# Patient Record
Sex: Male | Born: 1975 | Race: White | Hispanic: No | Smoking: Current some day smoker
Health system: Southern US, Community
[De-identification: ages and names within clinical notes are randomized; demographics above are authoritative.]

## PROBLEM LIST (undated history)

## (undated) DIAGNOSIS — A4902 Methicillin resistant Staphylococcus aureus infection, unspecified site: Secondary | ICD-10-CM

## (undated) DIAGNOSIS — L03116 Cellulitis of left lower limb: Secondary | ICD-10-CM

## (undated) DIAGNOSIS — M549 Dorsalgia, unspecified: Secondary | ICD-10-CM

## (undated) HISTORY — PX: WISDOM TOOTH EXTRACTION: SHX21

---

## 2006-04-06 DIAGNOSIS — L03116 Cellulitis of left lower limb: Secondary | ICD-10-CM

## 2006-04-06 HISTORY — DX: Cellulitis of left lower limb: L03.116

## 2006-11-17 ENCOUNTER — Emergency Department (HOSPITAL_COMMUNITY): Admission: EM | Admit: 2006-11-17 | Discharge: 2006-11-17 | Payer: Self-pay | Admitting: Emergency Medicine

## 2006-11-18 ENCOUNTER — Inpatient Hospital Stay (HOSPITAL_COMMUNITY): Admission: EM | Admit: 2006-11-18 | Discharge: 2006-11-21 | Payer: Self-pay | Admitting: Emergency Medicine

## 2010-04-01 ENCOUNTER — Emergency Department (HOSPITAL_COMMUNITY)
Admission: EM | Admit: 2010-04-01 | Discharge: 2010-04-01 | Payer: Self-pay | Source: Home / Self Care | Admitting: Emergency Medicine

## 2010-06-16 ENCOUNTER — Encounter (HOSPITAL_BASED_OUTPATIENT_CLINIC_OR_DEPARTMENT_OTHER): Payer: Self-pay

## 2010-07-14 ENCOUNTER — Encounter (HOSPITAL_BASED_OUTPATIENT_CLINIC_OR_DEPARTMENT_OTHER): Payer: Self-pay

## 2010-08-19 NOTE — Discharge Summary (Signed)
NAME:  Robert Ferguson, Robert Ferguson NO.:  [id number]   MEDICAL RECORD NO.:  [id number]          PATIENT TYPE:  INP   LOCATION:  A320                          FACILITY:  [facility]   PHYSICIAN:  Dorris Singh, DO    DATE OF BIRTH:  October 29, 1975   DATE OF ADMISSION:  11/18/2006  DATE OF DISCHARGE:  08/16/2008LH                               DISCHARGE SUMMARY   ADMISSION DIAGNOSIS:  Cellulitis of the left leg.   DISCHARGE DIAGNOSIS:  Cellulitis of the left leg.   SERVICE:  The patient was admitted to the service of [facility].   REFERRING PHYSICIANS:  There were no referring physicians.   CONSULTATIONS:  There were no consults.   PROCEDURES:  There were no procedures that were done.   HISTORY AND PHYSICAL EXAMINATION:  The patient is a 35 year old  Caucasian male who presented to [facility] ED with a week history of  left foot swelling.  He is a patient of Dr. Kathi Der.  He had gone to the  emergency room at [facility] because he had noticed a spot on his leg.  He  was given Keflex and after a couple days noticed that the swelling had  increased and he came to the [facility] emergency room, was given Septra  and told to return if the cellulitis got worse.  He returned on November 18, 2006 with increased swelling of the left leg and inability to walk.  At that point in time, he was admitted for IV therapy.   HOSPITAL COURSE:  The patient was started on Levaquin and vancomycin due  to the suspicion of failed therapy that he may have MRSA, and once he  started on the medication, on November 19, 2006 it was noted that there  was decreased redness and swelling markedly, as well, and the patient  stated he was able to get up and walk a little bit more than he had been  over the last few days.  Continued on course. The patient denied any  nausea, vomiting, fever or chills.  His white count remained within  normal limits.  On November 18, 2006, his white count was 7.3, on November 19, 2006 it was  6.0, and on November 20, 2006, again, it was 5.1.  His CBC  was within normal limits except for a low platelet count on November 18, 2006 of 149, on November 19, 2006 of 145, but on November 20, 2006 it was  185.  Everything else was pretty unremarkable.  CMP was within normal  limits.  He did have an elevated alkaline phosphatase, but his liver  enzymes were within normal limits, as well as his UA.  Blood cultures  that were done demonstrated no growth within 2 days.  The patient  continued to improve.  On November 20, 2006, it was discussed with the  patient if he felt like he could go home.  The patient wanted to leave  the hospital at this point in time.  It was determined that he could be  discharged on November 21, 2006 after his last  IV antibiotic treatment.  The patient was in agreement with that.   DISCHARGE CONDITION:  Good and stable.   DISPOSITION:  He will be discharged to home with the specific  instructions to contact his primary care physician.   MEDICATIONS:  Medications he will be sent home on include the Levaquin,  and then he will stop the Septra and the Keflex that he was given and  just continue on with the Levaquin 750 mg daily.   FOLLOWUP:  He is told to follow up with Dr. Kathi Der office within 1-3  days for any other follow-up and for monitoring and progression of his  current condition.      Dorris Singh, DO  Electronically Signed     CB/MEDQ  D:  11/20/2006  T:  11/21/2006  Job:  [id number]

## 2010-08-19 NOTE — Group Therapy Note (Signed)
NAME:  Robert Ferguson, SEAGO NO.:  [id number]   MEDICAL RECORD NO.:  [id number]          PATIENT TYPE:  INP   LOCATION:  A320                          FACILITY:  [facility]   PHYSICIAN:  Dorris Singh, DO    DATE OF BIRTH:  [birth year]   DATE OF PROCEDURE:  DATE OF DISCHARGE:                                 PROGRESS NOTE   SUBJECTIVE:  The patient is a 35 year old Caucasian male who presented  to [facility] on August 14 with the complaints of left leg pain. The  patient was admitted for lower extremity cellulitis and started on  vancomycin and Levaquin. Since his admission, the patient's area of  erythema, that was demarcated on his leg, has greatly improved and we  have seen a resection of the redness. The patient stated that today was  first day in four days he was able to actually stand on his leg and take  a shower and definitely has been a change in the redness and pain and  swelling.   PHYSICAL EXAMINATION:  VITAL SIGNS:  His current vitals are as follows:  Temperature 97.8, pulse 58, respirations 28 and blood pressure 135/71.  GENERAL:  Generally the patient is a 35 year old Caucasian male who is  well-developed, well-nourished and in no acute distress.  HEART:  Regular rate and rhythm.  No murmurs, gallops or rubs.  LUNGS:  Respirations are symmetrical.  Breath sounds are clear to  auscultation bilaterally.  No wheezes, rales or rhonchi.  ABDOMEN:  Soft, nontender, and nondistended.  EXTREMITIES:  Left lower extremity positive swelling which has decreased  from yesterday's examination.  Also, the patient had a circular region  of erythema in his inner left thigh that has completely resolved.   LABORATORY DATA:  CBC was done today with a WBC count of 6.0, hemoglobin  of 15.1, hematocrit 43.5, platelet count of 145. Also his INR was one,  PTT is 33. He had a CMP done with a sodium of 142, potassium of five,  chloride of  105, CO2 of 30, glucose of 100, BUN 13, alk  phos of 129,  albumin of 3.1.  He had a urine done with a high specific gravity of  1.030 and a small amount of bilirubin was noted. Liver enzymes were  within normal limits as well.   ASSESSMENT:  Cellulitis of the left leg.   PLAN:  Plan is to continue with current IV therapy utilizing vancomycin  and Levaquin.  The patient continues to improve.  Will encourage more  ambulation and if the patient continues to improve in one day, we will  anticipate discharge in one to two days switching the patient over to  p.o. medications for coverage of infection.      Dorris Singh, DO  Electronically Signed    CB/MEDQ  D:  11/19/2006  T:  11/20/2006  Job:  [id number]

## 2010-08-19 NOTE — H&P (Signed)
NAME:  Robert Ferguson, Robert Ferguson NO.:  [id number]   MEDICAL RECORD NO.:  [id number]          PATIENT TYPE:  INP   LOCATION:  A320                          FACILITY:  [facility]   PHYSICIAN:  Dorris Singh, DO    DATE OF BIRTH:  September 19, 1975   DATE OF ADMISSION:  11/18/2006  DATE OF DISCHARGE:  LH                              HISTORY & PHYSICAL   The patient is a 35 year old Caucasian male who presented to [facility]  ED with a week history of foot swelling.  The patient is a patient of  Dr. [name] and last week and gone to the emergency room at [facility]  because he had noticed a red spot on his left leg.  He was given Keflex  and took it for a couple of days and noticed that the swelling increased  as well as the pain.  He came to [facility] emergency room about 2 days  ago and was given Septra and told to return if the cellulitis continued  to get worse.  He returned today on August 14 with an increased swelling  of the left leg.  The patient was then admitted to [facility].   PAST MEDICAL HISTORY:  The patient denies any past medical history.  The  patient denies any surgeries.   FAMILY HISTORY:  Significant for colon cancer and diabetes.   The patient admits to being a social smoker, probably, he stated only  when he goes out with friends, and also admits to being a social  drinker.   MEDICATIONS THAT HE WAS ON:  He is on Keflex 500 mg four times a day and  Septra twice a day.   REVIEW OF SYSTEMS:  GENERAL:  The patient denies any weakness or  fatigue.  EARS, NOSE AND THROAT:  Noncontributory.  HEART:  Noncontributory.  No palpitations or shortness of breath.  LUNGS:  No  shortness of breath or wheezing.  ABDOMEN:  No nausea, vomiting,  constipation or diarrhea.  GU: No hesitancy, polyuria, polydipsia.  EXTREMITIES:  Left leg:  Positive swelling, positive pain, and increased  redness.  Also the patient noted that he a spot of erythema that  occurred on the  interior portion of his left inner thigh as well.   VITALS:  Temperature 98.5, pulse 69, respirations 20, blood pressure  127/85.  GENERAL:  This is a 35 year old Caucasian male who is well-developed,  well-nourished, in no acute distress.  His affect is appropriate.  SKIN:  The patient has five tattoos.  No skin scars or rashes.  Hair  consistency within normal limits.  No nail pitting.  Head:  Normocephalic, atraumatic.  Eyes equal, reactive to light  bilaterally.  No conjunctival injection or scleral icterus.  Ears:  Symmetrical bilaterally.  TMs visualized and gross auditory acuity  intact.  Nose:  Symmetrical.  No turbinate inflammation or mucosal  discharge.  Mouth and throat:  Teeth are in good repair.  No erythema or  exudate noted.  NECK.  No mass.  Full range of motion, no tracheal deviation.  HEART:  Regular rate and rhythm.  No rubs, gallops, murmurs or clicks.  LUNGS:  Chest is symmetrical with respiration.  No wheezes, rales or  rhonchi.  ABDOMEN:  Soft, nontender, nondistended.  No rebound, no masses or  guarding noted.  No organomegaly noted or CVA tenderness.  MUSCULOSKELETAL:  No muscular atrophy or weakness, full ranges of motion  all extremities.  Positive redness and swelling and tenderness of the  left foreleg and foot.  NEUROLOGIC:  Cranial nerves II-XII grossly intact.   Labs that were done on the patient include a CBC, which demonstrated a  white count of 7.3, hemoglobin of 15.6, hematocrit of 44.7, and platelet  count of 149.  Also a CMP that was done, sodium 138, potassium 4.1,  chloride 102, carbon dioxide 28, glucose 100, BUN 12, creatinine 1.16.  I think those are the only tests he had done.   ASSESSMENT:  Cellulitis of the left leg.   PLAN:  1. Will admit the patient to the service of [facility].  2. Will start IV antibiotics due to the patient failing two      antibiotics outpatient.  Will start the patient on vancomycin to      cover the  possibility of MRSA and also start him on Levaquin.  3. Will also do GI and DVT prophylaxis.  4. Will have the patient on bedrest with elevating the limb and will      continue to monitor him at this point in time.   I explained to the patient the plan of treatment and the patient stated  understanding.      Dorris Singh, DO  Electronically Signed     CB/MEDQ  D:  11/19/2006  T:  11/19/2006  Job:  [id number]

## 2011-01-16 LAB — PROTIME-INR
INR: 1
Prothrombin Time: 13.3

## 2011-01-16 LAB — URINALYSIS, ROUTINE W REFLEX MICROSCOPIC
Glucose, UA: NEGATIVE
Hgb urine dipstick: NEGATIVE
Ketones, ur: NEGATIVE
Nitrite: NEGATIVE
Protein, ur: NEGATIVE
Specific Gravity, Urine: >1.03 — ABNORMAL HIGH
Urobilinogen, UA: 0.2
pH: 5.5

## 2011-01-16 LAB — BASIC METABOLIC PANEL
BUN: 10
CO2: 32
Calcium: 9.2
Chloride: 105
Creatinine, Ser: 1.23
GFR calc Af Amer: >60
GFR calc non Af Amer: >60
Glucose, Bld: 105 — ABNORMAL HIGH
Potassium: 4.8
Sodium: 140

## 2011-01-16 LAB — DIFFERENTIAL
Basophils Absolute: 0
Basophils Absolute: 0
Basophils Relative: 0
Basophils Relative: 0
Eosinophils Absolute: 0.1
Eosinophils Absolute: 0.1
Eosinophils Relative: 1
Eosinophils Relative: 1
Lymphocytes Relative: 23
Lymphocytes Relative: 26
Lymphs Abs: 1.3
Lymphs Abs: 1.4
Monocytes Absolute: 0.5
Monocytes Absolute: 0.7
Monocytes Relative: 11
Monocytes Relative: 13 — ABNORMAL HIGH
Neutro Abs: 3.2
Neutro Abs: 3.8
Neutrophils Relative %: 62
Neutrophils Relative %: 63

## 2011-01-16 LAB — CBC
HCT: 43.5
HCT: 44
Hemoglobin: 15.1
Hemoglobin: 15.3
MCHC: 34.7
MCHC: 34.7
MCV: 85.1
MCV: 86.2
Platelets: 145 — ABNORMAL LOW
Platelets: 185
RBC: 5.04
RBC: 5.17
RDW: 11.9
RDW: 12.1
WBC: 5.1
WBC: 6

## 2011-01-16 LAB — COMPREHENSIVE METABOLIC PANEL
ALT: 52
AST: 27
Albumin: 3.1 — ABNORMAL LOW
Alkaline Phosphatase: 129 — ABNORMAL HIGH
BUN: 13
CO2: 30
Calcium: 9.1
Chloride: 105
Creatinine, Ser: 1.19
GFR calc Af Amer: >60
GFR calc non Af Amer: >60
Glucose, Bld: 100 — ABNORMAL HIGH
Potassium: 5
Sodium: 142
Total Bilirubin: 0.5
Total Protein: 6.4

## 2011-01-16 LAB — APTT: aPTT: 33

## 2011-01-16 LAB — VANCOMYCIN, TROUGH: Vancomycin Tr: 9.7

## 2011-01-18 ENCOUNTER — Encounter: Payer: Self-pay | Admitting: *Deleted

## 2011-01-18 ENCOUNTER — Emergency Department (HOSPITAL_COMMUNITY)
Admission: EM | Admit: 2011-01-18 | Discharge: 2011-01-18 | Disposition: A | Discharge status: Home / Self Care | Payer: Medicaid Other | Attending: Emergency Medicine | Admitting: Emergency Medicine

## 2011-01-18 DIAGNOSIS — L02419 Cutaneous abscess of limb, unspecified: Secondary | ICD-10-CM | POA: Insufficient documentation

## 2011-01-18 DIAGNOSIS — F17200 Nicotine dependence, unspecified, uncomplicated: Secondary | ICD-10-CM | POA: Insufficient documentation

## 2011-01-18 DIAGNOSIS — L02416 Cutaneous abscess of left lower limb: Secondary | ICD-10-CM

## 2011-01-18 DIAGNOSIS — Z8614 Personal history of Methicillin resistant Staphylococcus aureus infection: Secondary | ICD-10-CM | POA: Insufficient documentation

## 2011-01-18 HISTORY — DX: Methicillin resistant Staphylococcus aureus infection, unspecified site: A49.02

## 2011-01-18 MED ORDER — HYDROCODONE-ACETAMINOPHEN 5-325 MG PO TABS: 1.0000 | ORAL_TABLET | Freq: Four times a day (QID) | ORAL | Status: AC | PRN

## 2011-01-18 MED ORDER — DOXYCYCLINE HYCLATE 100 MG PO CAPS: 100.0000 mg | ORAL_CAPSULE | Freq: Two times a day (BID) | ORAL | Status: AC

## 2011-01-18 MED ORDER — IBUPROFEN 800 MG PO TABS
800.0000 mg | ORAL_TABLET | Freq: Once | ORAL | Status: AC
  Administered 2011-01-18: 800 mg via ORAL
  Filled 2011-01-18: qty 1

## 2011-01-18 MED ORDER — DOXYCYCLINE HYCLATE 100 MG PO TABS
100.0000 mg | ORAL_TABLET | Freq: Once | ORAL | Status: AC
  Administered 2011-01-18: 100 mg via ORAL
  Filled 2011-01-18: qty 1

## 2011-01-18 NOTE — ED Notes (Signed)
Pt has large reddened, warm and hard area to his left knee. Open area noted in the center. No drainage noted at this time. Pt alert and oriented x 3. Skin warm and dry. Color pink. Breath sounds clear and equal bilaterally.

## 2011-01-18 NOTE — ED Provider Notes (Signed)
History     CSN: [id number] Arrival date & time: 01/18/2011  8:15 AM  Chief Complaint  Patient presents with  . Abscess    (Consider location/radiation/quality/duration/timing/severity/associated sxs/prior treatment) Patient is a 35 y.o. male presenting with abscess. The history is provided by the patient. No language interpreter was used.  Abscess  This is a new problem. Episode onset: 2 days ago. The problem has been gradually worsening. The abscess is present on the left lower leg. The problem is moderate. The abscess is characterized by draining and redness. The abscess first occurred at home. Pertinent negatives include no fever. His past medical history is significant for skin abscesses in family. His past medical history does not include atopy in family. There were no sick contacts. He has received no recent medical care.    Past Medical History  Diagnosis Date  . MRSA infection     History reviewed. No pertinent past surgical history.  No family history on file.  History  Substance Use Topics  . Smoking status: Current Some Day Smoker  . Smokeless tobacco: Not on file  . Alcohol Use: Yes     Occ      Review of Systems  Constitutional: Negative for fever.  Skin: Positive for wound.  All other systems reviewed and are negative.    Allergies  Review of patient's allergies indicates no known allergies.  Home Medications  No current outpatient prescriptions on file.  BP 158/97  Pulse 102  Temp(Src) 98.7 F (37.1 C) (Oral)  Resp 16  Ht 6' (1.829 m)  SpO2 98%  Physical Exam  Nursing note and vitals reviewed. Constitutional: He is oriented to person, place, and time. Vital signs are normal. He appears well-developed and well-nourished. No distress.  HENT:  Head: Normocephalic and atraumatic.  Right Ear: External ear normal.  Left Ear: External ear normal.  Nose: Nose normal.  Mouth/Throat: No oropharyngeal exudate.  Eyes: Conjunctivae and EOM are  normal. Pupils are equal, round, and reactive to light. Right eye exhibits no discharge. Left eye exhibits no discharge. No scleral icterus.  Neck: Normal range of motion. Neck supple. No JVD present. No tracheal deviation present. No thyromegaly present.  Cardiovascular: Normal rate, regular rhythm, normal heart sounds, intact distal pulses and normal pulses.  Exam reveals no gallop and no friction rub.   No murmur heard. Pulmonary/Chest: Effort normal and breath sounds normal. No stridor. No respiratory distress. He has no wheezes. He has no rales. He exhibits no tenderness.  Abdominal: Soft. Normal appearance and bowel sounds are normal. He exhibits no distension and no mass. There is no tenderness. There is no rebound and no guarding.  Musculoskeletal: Normal range of motion. He exhibits no edema and no tenderness.  Lymphadenopathy:    He has no cervical adenopathy.  Neurological: He is alert and oriented to person, place, and time. He has normal reflexes. Coordination normal. GCS eye subscore is 4. GCS verbal subscore is 5. GCS motor subscore is 6.  Skin: Skin is warm and dry. No rash noted. He is not diaphoretic.     Psychiatric: He has a normal mood and affect. His speech is normal and behavior is normal. Judgment and thought content normal. Cognition and memory are normal.    ED Course  Procedures (including critical care time)  Labs Reviewed - No data to display No results found.   No diagnosis found.    MDM  Pt agrees to tx plan and will f/u with PCP.  Worthy Rancher, PA 01/18/11 [phone]

## 2011-01-18 NOTE — ED Notes (Signed)
Pt states boil to left knee with hx of mrsa. Pt states area has been draining a little bit. NAD

## 2011-01-18 NOTE — ED Provider Notes (Signed)
Medical screening examination/treatment/procedure(s) were performed by non-physician practitioner and as supervising physician I was immediately available for consultation/collaboration.  Glynn Octave, MD 01/18/11 [phone]

## 2011-01-19 LAB — DIFFERENTIAL
Basophils Absolute: 0
Basophils Relative: 0
Eosinophils Absolute: 0.1
Eosinophils Relative: 1
Lymphocytes Relative: 20
Lymphs Abs: 1.4
Monocytes Absolute: 0.8 — ABNORMAL HIGH
Monocytes Relative: 11
Neutro Abs: 5
Neutrophils Relative %: 69

## 2011-01-19 LAB — BASIC METABOLIC PANEL
BUN: 12
CO2: 28
Calcium: 8.9
Chloride: 102
Creatinine, Ser: 1.16
GFR calc Af Amer: >60
GFR calc non Af Amer: >60
Glucose, Bld: 100 — ABNORMAL HIGH
Potassium: 4.1
Sodium: 138

## 2011-01-19 LAB — CULTURE, BLOOD (ROUTINE X 2)
Culture: NO GROWTH
Culture: NO GROWTH
Report Status: 8192008
Report Status: 8192008

## 2011-01-19 LAB — CBC
HCT: 44.7
Hemoglobin: 15.6
MCHC: 34.8
MCV: 85.1
Platelets: 149 — ABNORMAL LOW
RBC: 5.26
RDW: 12.2
WBC: 7.3

## 2011-03-24 ENCOUNTER — Emergency Department (HOSPITAL_COMMUNITY)
Admission: EM | Admit: 2011-03-24 | Discharge: 2011-03-24 | Disposition: A | Discharge status: Home / Self Care | Payer: Medicaid Other | Attending: Emergency Medicine | Admitting: Emergency Medicine

## 2011-03-24 ENCOUNTER — Encounter (HOSPITAL_COMMUNITY): Payer: Self-pay | Admitting: *Deleted

## 2011-03-24 DIAGNOSIS — F17200 Nicotine dependence, unspecified, uncomplicated: Secondary | ICD-10-CM | POA: Insufficient documentation

## 2011-03-24 DIAGNOSIS — L03119 Cellulitis of unspecified part of limb: Secondary | ICD-10-CM | POA: Insufficient documentation

## 2011-03-24 DIAGNOSIS — L02419 Cutaneous abscess of limb, unspecified: Secondary | ICD-10-CM | POA: Insufficient documentation

## 2011-03-24 MED ORDER — ONDANSETRON HCL 4 MG PO TABS
4.0000 mg | ORAL_TABLET | Freq: Once | ORAL | Status: AC
  Administered 2011-03-24: 4 mg via ORAL
  Filled 2011-03-24: qty 1

## 2011-03-24 MED ORDER — HYDROCODONE-ACETAMINOPHEN 5-325 MG PO TABS: 1.0000 | ORAL_TABLET | ORAL | Status: AC | PRN

## 2011-03-24 MED ORDER — AMOXICILLIN-POT CLAVULANATE 875-125 MG PO TABS
1.0000 | ORAL_TABLET | Freq: Once | ORAL | Status: AC
  Administered 2011-03-24: 1 via ORAL
  Filled 2011-03-24: qty 1

## 2011-03-24 MED ORDER — AMOXICILLIN 500 MG PO CAPS: ORAL_CAPSULE | ORAL | Status: DC

## 2011-03-24 MED ORDER — SULFAMETHOXAZOLE-TRIMETHOPRIM 800-160 MG PO TABS: ORAL_TABLET | ORAL | Status: DC

## 2011-03-24 MED ORDER — SULFAMETHOXAZOLE-TMP DS 800-160 MG PO TABS
1.0000 | ORAL_TABLET | Freq: Once | ORAL | Status: AC
  Administered 2011-03-24: 1 via ORAL
  Filled 2011-03-24: qty 1

## 2011-03-24 NOTE — ED Provider Notes (Signed)
History     CSN: [id number] Arrival date & time: 03/24/2011  2:20 PM   None     Chief Complaint  Patient presents with  . Abscess    (Consider location/radiation/quality/duration/timing/severity/associated sxs/prior treatment) Patient is a 35 y.o. male presenting with abscess. The history is provided by the patient.  Abscess  This is a new problem. The current episode started less than one week ago. The problem occurs continuously. The problem has been gradually worsening. The abscess is present on the left upper leg. The problem is moderate. The abscess is characterized by redness and painfulness. It is unknown what he was exposed to. Pertinent negatives include no anorexia, no fever and no cough. Past medical history comments: Similar symptoms previously. There were no sick contacts. He has received no recent medical care.    Past Medical History  Diagnosis Date  . MRSA infection     History reviewed. No pertinent past surgical history.  History reviewed. No pertinent family history.  History  Substance Use Topics  . Smoking status: Current Some Day Smoker    Types: Cigarettes  . Smokeless tobacco: Not on file  . Alcohol Use: Yes     Occ      Review of Systems  Constitutional: Negative for fever and activity change.       All ROS Neg except as noted in HPI  HENT: Negative for nosebleeds and neck pain.   Eyes: Negative for photophobia and discharge.  Respiratory: Negative for cough, shortness of breath and wheezing.   Cardiovascular: Negative for chest pain and palpitations.  Gastrointestinal: Negative for abdominal pain, blood in stool and anorexia.  Genitourinary: Negative for dysuria, frequency and hematuria.  Musculoskeletal: Negative for back pain and arthralgias.  Skin: Negative.   Neurological: Negative for dizziness, seizures and speech difficulty.  Psychiatric/Behavioral: Negative for hallucinations and confusion.    Allergies  Review of patient's  allergies indicates no known allergies.  Home Medications  No current outpatient prescriptions on file.  BP 148/87  Pulse 77  Temp(Src) 98 F (36.7 C) (Oral)  Resp 16  Ht 6' (1.829 m)  Wt 195 lb (88.451 kg)  BMI 26.45 kg/m2  SpO2 98%  Physical Exam  Nursing note and vitals reviewed. Constitutional: He is oriented to person, place, and time. He appears well-developed and well-nourished.  Non-toxic appearance.  HENT:  Head: Normocephalic.  Right Ear: Tympanic membrane and external ear normal.  Left Ear: Tympanic membrane and external ear normal.  Eyes: EOM and lids are normal. Pupils are equal, round, and reactive to light.  Neck: Normal range of motion. Neck supple. Carotid bruit is not present.  Cardiovascular: Normal rate, regular rhythm, normal heart sounds, intact distal pulses and normal pulses.   Pulmonary/Chest: Breath sounds normal. No respiratory distress.  Abdominal: Soft. Bowel sounds are normal. There is no tenderness. There is no guarding.  Musculoskeletal: Normal range of motion.       Small abscess of the left lateral thigh with increase redness around the site. No palpable nodes  Lymphadenopathy:       Head (right side): No submandibular adenopathy present.       Head (left side): No submandibular adenopathy present.    He has no cervical adenopathy.  Neurological: He is alert and oriented to person, place, and time. He has normal strength. No cranial nerve deficit or sensory deficit.  Skin: Skin is warm and dry.  Psychiatric: He has a normal mood and affect. His speech is normal.  ED Course  Procedures (including critical care time)  Labs Reviewed - No data to display No results found.   No diagnosis found.    MDM  I have reviewed nursing notes, vital signs, and all appropriate lab and imaging results for this patient.  This abscess is not a candidate for I and D at this time. Will treat with antibiotics and tub soaks. Pt to see his MD or return  to the ED for evaluation.      Kathie Dike, [place] 03/24/11 1728

## 2011-03-24 NOTE — ED Notes (Signed)
Pt c/o boil on his left upper leg since yesterday. Pt states that it is hard and red. Denies drainage.

## 2011-03-24 NOTE — ED Provider Notes (Signed)
Medical screening examination/treatment/procedure(s) were performed by non-physician practitioner and as supervising physician I was immediately available for consultation/collaboration.   Shaun Zuccaro, MD 03/24/11 1753 

## 2011-12-28 ENCOUNTER — Emergency Department (HOSPITAL_COMMUNITY)
Admission: EM | Admit: 2011-12-28 | Discharge: 2011-12-28 | Disposition: A | Discharge status: Home / Self Care | Payer: Self-pay | Attending: Emergency Medicine | Admitting: Emergency Medicine

## 2011-12-28 ENCOUNTER — Encounter (HOSPITAL_COMMUNITY): Payer: Self-pay | Admitting: *Deleted

## 2011-12-28 ENCOUNTER — Emergency Department (HOSPITAL_COMMUNITY): Payer: Self-pay

## 2011-12-28 DIAGNOSIS — M25579 Pain in unspecified ankle and joints of unspecified foot: Secondary | ICD-10-CM | POA: Insufficient documentation

## 2011-12-28 DIAGNOSIS — Z8614 Personal history of Methicillin resistant Staphylococcus aureus infection: Secondary | ICD-10-CM | POA: Insufficient documentation

## 2011-12-28 DIAGNOSIS — L03119 Cellulitis of unspecified part of limb: Secondary | ICD-10-CM

## 2011-12-28 DIAGNOSIS — F17200 Nicotine dependence, unspecified, uncomplicated: Secondary | ICD-10-CM | POA: Insufficient documentation

## 2011-12-28 LAB — CBC WITH DIFFERENTIAL/PLATELET
Basophils Absolute: 0 10*3/uL (ref 0.0–0.1)
Basophils Relative: 0 % (ref 0–1)
Eosinophils Absolute: 0.1 10*3/uL (ref 0.0–0.7)
Eosinophils Relative: 1 % (ref 0–5)
HCT: 51.2 % (ref 39.0–52.0)
Hemoglobin: 17.9 g/dL — ABNORMAL HIGH (ref 13.0–17.0)
Lymphocytes Relative: 17 % (ref 12–46)
Lymphs Abs: 1.7 10*3/uL (ref 0.7–4.0)
MCH: 30.4 pg (ref 26.0–34.0)
MCHC: 35 g/dL (ref 30.0–36.0)
MCV: 86.9 fL (ref 78.0–100.0)
Monocytes Absolute: 0.7 10*3/uL (ref 0.1–1.0)
Monocytes Relative: 7 % (ref 3–12)
Neutro Abs: 7.5 10*3/uL (ref 1.7–7.7)
Neutrophils Relative %: 75 % (ref 43–77)
Platelets: 157 10*3/uL (ref 150–400)
RBC: 5.89 MIL/uL — ABNORMAL HIGH (ref 4.22–5.81)
RDW: 11.6 % (ref 11.5–15.5)
WBC: 10.1 10*3/uL (ref 4.0–10.5)

## 2011-12-28 LAB — URIC ACID: Uric Acid, Serum: 6.2 mg/dL (ref 4.0–7.8)

## 2011-12-28 MED ORDER — CEFTRIAXONE SODIUM 1 G IJ SOLR
1.0000 g | Freq: Once | INTRAMUSCULAR | Status: AC
  Administered 2011-12-28: 1 g via INTRAMUSCULAR
  Filled 2011-12-28: qty 10

## 2011-12-28 MED ORDER — DOXYCYCLINE HYCLATE 100 MG PO TABS
100.0000 mg | ORAL_TABLET | Freq: Once | ORAL | Status: AC
  Administered 2011-12-28: 100 mg via ORAL
  Filled 2011-12-28: qty 1

## 2011-12-28 MED ORDER — LIDOCAINE HCL (PF) 1 % IJ SOLN
INTRAMUSCULAR | Status: AC
  Administered 2011-12-28: 22:00:00
  Filled 2011-12-28: qty 5

## 2011-12-28 MED ORDER — SULFAMETHOXAZOLE-TRIMETHOPRIM 800-160 MG PO TABS: 1.0000 | ORAL_TABLET | Freq: Two times a day (BID) | ORAL | Status: DC

## 2011-12-28 MED ORDER — OXYCODONE-ACETAMINOPHEN 5-325 MG PO TABS: 1.0000 | ORAL_TABLET | ORAL | Status: AC | PRN

## 2011-12-28 MED ORDER — OXYCODONE-ACETAMINOPHEN 5-325 MG PO TABS
1.0000 | ORAL_TABLET | Freq: Once | ORAL | Status: AC
Start: 2011-12-28 — End: 2011-12-28
  Administered 2011-12-28: 1 via ORAL
  Filled 2011-12-28: qty 1

## 2011-12-28 NOTE — ED Notes (Signed)
Cellulitis rt ankle, redness and swelling present

## 2011-12-28 NOTE — ED Provider Notes (Signed)
History     CSN: [id number]  Arrival date & time 12/28/11  1932   First MD Initiated Contact with Patient 12/28/11 2037      Chief Complaint  Patient presents with  . Cellulitis    (Consider location/radiation/quality/duration/timing/severity/associated sxs/prior treatment) HPI Comments: Patient c/o redness, mild swelling and "soreness" to the right ankle.  States he has a hx of previous cellulitis to the same ankle and is concerned he has similar sx's again.  He denies recent injury, fever, chills, calf pain , open wound or red streaks.    Patient is a 36 y.o. male presenting with ankle pain. The history is provided by the patient.  Ankle Pain  The incident occurred 2 days ago. The incident occurred at home. There was no injury mechanism. The pain is present in the right ankle. The quality of the pain is described as aching. The pain is mild. The pain has been constant since onset. Pertinent negatives include no numbness, no inability to bear weight, no loss of motion, no muscle weakness, no loss of sensation and no tingling. He reports no foreign bodies present. Nothing aggravates the symptoms. He has tried nothing for the symptoms. The treatment provided no relief.    Past Medical History  Diagnosis Date  . MRSA infection     History reviewed. No pertinent past surgical history.  History reviewed. No pertinent family history.  History  Substance Use Topics  . Smoking status: Current Some Day Smoker    Types: Cigarettes  . Smokeless tobacco: Not on file  . Alcohol Use: Yes     Occ      Review of Systems  Constitutional: Negative for fever and chills.  Genitourinary: Negative for dysuria and difficulty urinating.  Musculoskeletal: Positive for joint swelling and arthralgias.  Skin: Positive for color change. Negative for rash and wound.  Neurological: Negative for tingling and numbness.  All other systems reviewed and are negative.    Allergies  Review of  patient's allergies indicates no known allergies.  Home Medications  No current outpatient prescriptions on file.  BP 115/58  Pulse 73  Temp 98.1 F (36.7 C) (Oral)  Resp 20  Ht 6' (1.829 m)  Wt 198 lb (89.812 kg)  BMI 26.85 kg/m2  SpO2 98%  Physical Exam  Nursing note and vitals reviewed. Constitutional: He is oriented to person, place, and time. He appears well-developed and well-nourished. No distress.  HENT:  Head: Normocephalic and atraumatic.  Cardiovascular: Normal rate, regular rhythm, normal heart sounds and intact distal pulses.   Pulmonary/Chest: Effort normal and breath sounds normal.  Musculoskeletal: He exhibits edema and tenderness.       Right ankle: He exhibits swelling. He exhibits normal range of motion, no ecchymosis, no deformity, no laceration and normal pulse. tenderness. Medial malleolus tenderness found. No posterior TFL, no head of 5th metatarsal and no proximal fibula tenderness found. Achilles tendon normal.       Feet:       Right medial ankle is ttp, slight STS is present. Slight erythema also present. ROM is preserved.  DP pulse is brisk, sensation intact.  No  abrasion, bruising, calf pain open wounds, or deformity.    Neurological: He is alert and oriented to person, place, and time. He exhibits normal muscle tone. Coordination normal.  Skin: Skin is warm and dry.    ED Course  Procedures (including critical care time)  Labs Reviewed  CBC WITH DIFFERENTIAL - Abnormal; Notable for the following:  RBC 5.89 (*)     Hemoglobin 17.9 (*)     All other components within normal limits  URIC ACID   Dg Ankle Complete Right  12/28/2011  *RADIOLOGY REPORT*  Clinical Data: Redness, pain and swelling.  Medial right ankle pain.  RIGHT ANKLE - COMPLETE 3+ VIEW  Comparison: None.  Findings: No osteolysis.  The ankle mortise appears congruent. Nonstandard projections are submitted for interpretation.  Grossly, the talar dome appears intact.  No radiopaque  foreign body is identified.  There is a tiny insertional calcaneal spur.  IMPRESSION: No acute abnormality.   Original Report Authenticated By: Andreas Newport, M.D.        MDM    Localized area of erythema to the medial right ankle.  Hx of cellulitis, no known hx of gout.  I have marked the leading edge of erythema and pt agrees to return here in 1-2 days for recheck. Will start abx.  He is otherwise well appearing, no fever or leukocytosis .  The patient appears reasonably screened and/or stabilized for discharge and I doubt any other medical condition or other [facility] requiring further screening, evaluation, or treatment in the ED at this time prior to discharge.   Rx: Bactrim DS Percocet #20    Zyrah Wiswell L. [place], [place] 12/30/11 1842

## 2011-12-30 NOTE — ED Provider Notes (Signed)
Medical screening examination/treatment/procedure(s) were performed by non-physician practitioner and as supervising physician I was immediately available for consultation/collaboration.   Shelda Jakes, MD 12/30/11 2046

## 2012-01-16 DIAGNOSIS — R42 Dizziness and giddiness: Secondary | ICD-10-CM

## 2012-01-19 ENCOUNTER — Encounter (HOSPITAL_COMMUNITY): Payer: Self-pay | Admitting: *Deleted

## 2012-01-19 ENCOUNTER — Emergency Department (HOSPITAL_COMMUNITY)
Admission: EM | Admit: 2012-01-19 | Discharge: 2012-01-19 | Disposition: A | Discharge status: Home / Self Care | Payer: Self-pay | Attending: Emergency Medicine | Admitting: Emergency Medicine

## 2012-01-19 DIAGNOSIS — M549 Dorsalgia, unspecified: Secondary | ICD-10-CM | POA: Insufficient documentation

## 2012-01-19 DIAGNOSIS — Z8614 Personal history of Methicillin resistant Staphylococcus aureus infection: Secondary | ICD-10-CM | POA: Insufficient documentation

## 2012-01-19 DIAGNOSIS — F17200 Nicotine dependence, unspecified, uncomplicated: Secondary | ICD-10-CM | POA: Insufficient documentation

## 2012-01-19 DIAGNOSIS — X500XXA Overexertion from strenuous movement or load, initial encounter: Secondary | ICD-10-CM | POA: Insufficient documentation

## 2012-01-19 DIAGNOSIS — S29012A Strain of muscle and tendon of back wall of thorax, initial encounter: Secondary | ICD-10-CM

## 2012-01-19 MED ORDER — KETOROLAC TROMETHAMINE 10 MG PO TABS
10.0000 mg | ORAL_TABLET | Freq: Once | ORAL | Status: AC
  Administered 2012-01-19: 10 mg via ORAL
  Filled 2012-01-19: qty 1

## 2012-01-19 MED ORDER — HYDROCODONE-ACETAMINOPHEN 5-325 MG PO TABS
2.0000 | ORAL_TABLET | Freq: Once | ORAL | Status: AC
  Administered 2012-01-19: 2 via ORAL
  Filled 2012-01-19: qty 2

## 2012-01-19 MED ORDER — HYDROCODONE-ACETAMINOPHEN 5-325 MG PO TABS: 1.0000 | ORAL_TABLET | ORAL | Status: DC | PRN

## 2012-01-19 MED ORDER — METHOCARBAMOL 500 MG PO TABS
1000.0000 mg | ORAL_TABLET | Freq: Once | ORAL | Status: AC
  Administered 2012-01-19: 1000 mg via ORAL
  Filled 2012-01-19: qty 2

## 2012-01-19 MED ORDER — MELOXICAM 7.5 MG PO TABS: ORAL_TABLET | ORAL | Status: DC

## 2012-01-19 MED ORDER — METHOCARBAMOL 500 MG PO TABS: ORAL_TABLET | ORAL | Status: DC

## 2012-01-19 NOTE — ED Provider Notes (Signed)
History     CSN: [id number]  Arrival date & time 01/19/12  1736   None     Chief Complaint  Patient presents with  . Back Pain    (Consider location/radiation/quality/duration/timing/severity/associated sxs/prior treatment) HPI Comments: Patient states that he was lifting a older model TV on last evening. Patient states that this morning upon arising he had a severe pain in his mid back. The pain was worse with certain movements. The patient denies cough or hemoptysis. The patient denies any loss of bowel or bladder function. He's not had any previous injury or procedure to the mid back area. Patient states that the pain is getting progressively worse and he presents for assistance with his pain.  The history is provided by the patient.    Past Medical History  Diagnosis Date  . MRSA infection     History reviewed. No pertinent past surgical history.  History reviewed. No pertinent family history.  History  Substance Use Topics  . Smoking status: Current Some Day Smoker    Types: Cigarettes  . Smokeless tobacco: Not on file  . Alcohol Use: Yes     Occ      Review of Systems  Constitutional: Negative for activity change.       All ROS Neg except as noted in HPI  HENT: Negative for nosebleeds and neck pain.   Eyes: Negative for photophobia and discharge.  Respiratory: Negative for cough, shortness of breath and wheezing.   Cardiovascular: Negative for chest pain and palpitations.  Gastrointestinal: Negative for abdominal pain and blood in stool.  Genitourinary: Negative for dysuria, frequency and hematuria.  Musculoskeletal: Positive for back pain. Negative for arthralgias.  Skin: Negative.   Neurological: Negative for dizziness, seizures and speech difficulty.  Psychiatric/Behavioral: Negative for hallucinations and confusion.    Allergies  Review of patient's allergies indicates no known allergies.  Home Medications   Current Outpatient Rx  Name Route Sig  Dispense Refill  . SULFAMETHOXAZOLE-TRIMETHOPRIM 800-160 MG PO TABS Oral Take 1 tablet by mouth 2 (two) times daily. 20 tablet 0    BP 131/70  Pulse 63  Temp 98.6 F (37 C) (Oral)  Resp 18  Ht 6' (1.829 m)  Wt 195 lb (88.451 kg)  BMI 26.45 kg/m2  SpO2 98%  Physical Exam  Nursing note and vitals reviewed. Constitutional: He is oriented to person, place, and time. He appears well-developed and well-nourished.  Non-toxic appearance.  HENT:  Head: Normocephalic.  Right Ear: Tympanic membrane and external ear normal.  Left Ear: Tympanic membrane and external ear normal.  Eyes: EOM and lids are normal. Pupils are equal, round, and reactive to light.  Neck: Normal range of motion. Neck supple. Carotid bruit is not present.  Cardiovascular: Normal rate, regular rhythm, normal heart sounds, intact distal pulses and normal pulses.   Pulmonary/Chest: Breath sounds normal. No respiratory distress.  Abdominal: Soft. Bowel sounds are normal. There is no tenderness. There is no guarding.  Musculoskeletal: Normal range of motion.       There is pain to palpation and mid thoracic paraspinal area spasm present. No hot areas of the spine area. No bruising or deformity appreciated.  Lymphadenopathy:       Head (right side): No submandibular adenopathy present.       Head (left side): No submandibular adenopathy present.    He has no cervical adenopathy.  Neurological: He is alert and oriented to person, place, and time. He has normal strength. No cranial nerve deficit  or sensory deficit. He exhibits normal muscle tone. Coordination normal.  Skin: Skin is warm and dry.  Psychiatric: He has a normal mood and affect. His speech is normal.    ED Course  Procedures (including critical care time)  Labs Reviewed - No data to display No results found.   No diagnosis found.    MDM  I have reviewed nursing notes, vital signs, and all appropriate lab and imaging results for this patient. No gross  neurologic deficits appreciated on examination at this time. Prescription for Mobic 7.5 mg, Norco #15 tablets, and Robaxin 500 mg given to the patient. Patient is advised to use ice to 20 minute intervals to the lower back area.       Kathie Dike, [place] 01/19/12 [phone]

## 2012-01-19 NOTE — ED Notes (Signed)
Low back pain , onset after lifting a tv.

## 2012-01-20 NOTE — ED Provider Notes (Signed)
Medical screening examination/treatment/procedure(s) were performed by non-physician practitioner and as supervising physician I was immediately available for consultation/collaboration.   Gabriell Casimir L Elleni Mozingo, MD 01/20/12 0007 

## 2012-05-20 ENCOUNTER — Emergency Department (HOSPITAL_COMMUNITY)
Admission: EM | Admit: 2012-05-20 | Discharge: 2012-05-20 | Discharge status: Against Medical Advice | Payer: Self-pay | Attending: Emergency Medicine | Admitting: Emergency Medicine

## 2012-05-20 ENCOUNTER — Encounter (HOSPITAL_COMMUNITY): Payer: Self-pay | Admitting: *Deleted

## 2012-05-20 DIAGNOSIS — M7989 Other specified soft tissue disorders: Secondary | ICD-10-CM | POA: Insufficient documentation

## 2012-05-20 DIAGNOSIS — F17200 Nicotine dependence, unspecified, uncomplicated: Secondary | ICD-10-CM | POA: Insufficient documentation

## 2012-05-20 NOTE — ED Notes (Signed)
Left without being seen.

## 2012-05-20 NOTE — ED Notes (Signed)
Pt states lump on lower back with pain from old injury x 4 days, rt foot swelling with no pain

## 2012-05-23 ENCOUNTER — Encounter (HOSPITAL_COMMUNITY): Payer: Self-pay | Admitting: Emergency Medicine

## 2012-05-23 ENCOUNTER — Emergency Department (HOSPITAL_COMMUNITY): Payer: Self-pay

## 2012-05-23 ENCOUNTER — Emergency Department (HOSPITAL_COMMUNITY)
Admission: EM | Admit: 2012-05-23 | Discharge: 2012-05-23 | Disposition: A | Discharge status: Home / Self Care | Payer: Self-pay | Attending: Emergency Medicine | Admitting: Emergency Medicine

## 2012-05-23 DIAGNOSIS — R609 Edema, unspecified: Secondary | ICD-10-CM | POA: Insufficient documentation

## 2012-05-23 DIAGNOSIS — M549 Dorsalgia, unspecified: Secondary | ICD-10-CM | POA: Insufficient documentation

## 2012-05-23 DIAGNOSIS — R600 Localized edema: Secondary | ICD-10-CM

## 2012-05-23 DIAGNOSIS — Z87828 Personal history of other (healed) physical injury and trauma: Secondary | ICD-10-CM | POA: Insufficient documentation

## 2012-05-23 DIAGNOSIS — F17200 Nicotine dependence, unspecified, uncomplicated: Secondary | ICD-10-CM | POA: Insufficient documentation

## 2012-05-23 DIAGNOSIS — Z8614 Personal history of Methicillin resistant Staphylococcus aureus infection: Secondary | ICD-10-CM | POA: Insufficient documentation

## 2012-05-23 MED ORDER — HYDROCODONE-ACETAMINOPHEN 5-325 MG PO TABS: 1.0000 | ORAL_TABLET | ORAL | Status: DC | PRN

## 2012-05-23 MED ORDER — HYDROCODONE-ACETAMINOPHEN 5-325 MG PO TABS
1.0000 | ORAL_TABLET | Freq: Once | ORAL | Status: AC
  Administered 2012-05-23: 1 via ORAL
  Filled 2012-05-23: qty 1

## 2012-05-23 MED ORDER — HYDROCHLOROTHIAZIDE 25 MG PO TABS: 25.0000 mg | ORAL_TABLET | Freq: Every day | ORAL | Status: DC

## 2012-05-23 MED ORDER — IBUPROFEN 600 MG PO TABS: 600.0000 mg | ORAL_TABLET | Freq: Four times a day (QID) | ORAL | Status: DC | PRN

## 2012-05-23 NOTE — ED Notes (Addendum)
Patient with no complaints at this time. Respirations even and unlabored. Skin warm/dry. Discharge instructions reviewed with patient at this time. Patient given opportunity to voice concerns/ask questions. My Chart reviewed w/pt. Patient discharged at this time and left Emergency Department with steady gait.

## 2012-05-23 NOTE — ED Provider Notes (Signed)
History     CSN: [id number]  Arrival date & time 05/23/12  8295   First MD Initiated Contact with Patient 05/23/12 [phone]      Chief Complaint  Patient presents with  . Back Pain   HPI Robert Ferguson is a 37 y.o. male who presents to the ED with back pain. The pain started 5 days ago. The onset was sudden after walking down steps and felt something move in her back. History of back injury a several months ago. Pain off and on since then. Also complains of swelling in his right foot that is new. He denies pain in the area just swelling.  The history was provided by the patient.  Past Medical History  Diagnosis Date  . MRSA infection     History reviewed. No pertinent past surgical history.  History reviewed. No pertinent family history.  History  Substance Use Topics  . Smoking status: Current Some Day Smoker    Types: Cigarettes  . Smokeless tobacco: Not on file  . Alcohol Use: Yes     Comment: Occ      Review of Systems  Constitutional: Negative for fever and appetite change.  HENT: Negative for neck pain.   Eyes: Negative for visual disturbance.  Respiratory: Negative for cough and wheezing.   Cardiovascular: Negative for chest pain.  Gastrointestinal: Negative for nausea, vomiting and abdominal pain.  Musculoskeletal: Positive for back pain.       Swelling of right foot  Skin: Negative for wound.  Neurological: Negative for dizziness and headaches.  Psychiatric/Behavioral: Negative for confusion. The patient is not nervous/anxious.     Allergies  Tramadol  Home Medications  No current outpatient prescriptions on file.  Pulse 68  Temp(Src) 98.5 F (36.9 C) (Oral)  Resp 16  Ht 6' (1.829 m)  Wt 200 lb (90.719 kg)  BMI 27.12 kg/m2  SpO2 99%  Physical Exam  Nursing note and vitals reviewed. Constitutional: He is oriented to person, place, and time. He appears well-developed and well-nourished. No distress.  HENT:  Head: Normocephalic and atraumatic.   Eyes: EOM are normal. Pupils are equal, round, and reactive to light.  Neck: Normal range of motion. Neck supple.  Cardiovascular: Normal rate.   Pulmonary/Chest: Effort normal.  Musculoskeletal:  Tenderness right lumbar area and over the lumbar spine. Small lipomas palpable right lumbar area. Tender on exam. Right foot with edema. No tenderness, pedal pulses present.   Neurological: He is alert and oriented to person, place, and time. No cranial nerve deficit.  Skin: Skin is warm and dry.  Psychiatric: He has a normal mood and affect. His behavior is normal. Judgment and thought content normal.  Pulse 68  Temp(Src) 98.5 F (36.9 C) (Oral)  Resp 16  Ht 6' (1.829 m)  Wt 200 lb (90.719 kg)  BMI 27.12 kg/m2  SpO2 99%  BP 115/81  Procedures  Dg Lumbar Spine Complete  05/23/2012  *RADIOLOGY REPORT*  Clinical Data: Back pain without trauma  LUMBAR SPINE - COMPLETE 4+ VIEW  Comparison: None.  Findings: Transitional S1 vertebral body. Sacroiliac joints are symmetric.  Maintenance of vertebral body height and alignment. Intervertebral disc heights are maintained.  IMPRESSION: No acute osseous abnormality.   Original Report Authenticated By: Jeronimo Greaves, M.D.     12:30 Dr. Estell Harpin in to examine the patient.  Assessment: 37 y.o. male with low back pain   Edema of  right foot  Plan:  Pain management   Diuretic   Follow  up with PCP for further work up of edema right foot  Discussed with the patient and all questioned fully answered. He will return if problems arise.   Medication List    TAKE these medications       hydrochlorothiazide 25 MG tablet  Commonly known as:  HYDRODIURIL  Take 1 tablet (25 mg total) by mouth daily.     HYDROcodone-acetaminophen 5-325 MG per tablet  Commonly known as:  NORCO/VICODIN  Take 1 tablet by mouth every 4 (four) hours as needed for pain.     ibuprofen 600 MG tablet  Commonly known as:  ADVIL,MOTRIN  Take 1 tablet (600 mg total) by mouth every 6  (six) hours as needed for pain.         Janne Napoleon, NP 05/23/12 1248

## 2012-05-23 NOTE — ED Notes (Signed)
Pt states back started hurting five days ago. Pt states walking down steps and felt something move in back. Pt states injured back a few years ago.

## 2012-05-23 NOTE — ED Notes (Signed)
Pt states he is not driving and has a way home.

## 2012-05-23 NOTE — ED Notes (Signed)
Patient states that he is having lower back pain and he has noticed that his right foot is swelling.  States he has been having this problems for over 6 months.  States that injured his lower back about 6 months ago, but has not seen a doctor.

## 2012-05-23 NOTE — ED Provider Notes (Signed)
Medical screening examination/treatment/procedure(s) were performed by non-physician practitioner and as supervising physician I was immediately available for consultation/collaboration.   Melania Kirks L Hindy Perrault, MD 05/23/12 1535 

## 2012-05-23 NOTE — ED Notes (Signed)
Patient states he needs something for pain. RN made aware.

## 2012-09-24 ENCOUNTER — Inpatient Hospital Stay (HOSPITAL_COMMUNITY)
Admission: EM | Admit: 2012-09-24 | Discharge: 2012-09-25 | DRG: 603 | Disposition: A | Discharge status: Home / Self Care | Payer: MEDICAID | Attending: Internal Medicine | Admitting: Internal Medicine

## 2012-09-24 ENCOUNTER — Encounter (HOSPITAL_COMMUNITY): Payer: Self-pay | Admitting: *Deleted

## 2012-09-24 DIAGNOSIS — D696 Thrombocytopenia, unspecified: Secondary | ICD-10-CM | POA: Diagnosis present

## 2012-09-24 DIAGNOSIS — L02419 Cutaneous abscess of limb, unspecified: Principal | ICD-10-CM | POA: Diagnosis present

## 2012-09-24 DIAGNOSIS — L03115 Cellulitis of right lower limb: Secondary | ICD-10-CM | POA: Diagnosis present

## 2012-09-24 DIAGNOSIS — F17200 Nicotine dependence, unspecified, uncomplicated: Secondary | ICD-10-CM | POA: Diagnosis present

## 2012-09-24 DIAGNOSIS — L03119 Cellulitis of unspecified part of limb: Principal | ICD-10-CM | POA: Diagnosis present

## 2012-09-24 HISTORY — DX: Dorsalgia, unspecified: M54.9

## 2012-09-24 HISTORY — DX: Cellulitis of left lower limb: L03.116

## 2012-09-24 LAB — CBC WITH DIFFERENTIAL/PLATELET
Basophils Absolute: 0 10*3/uL (ref 0.0–0.1)
Basophils Relative: 0 % (ref 0–1)
Eosinophils Absolute: 0 10*3/uL (ref 0.0–0.7)
Eosinophils Relative: 0 % (ref 0–5)
HCT: 48 % (ref 39.0–52.0)
Hemoglobin: 16.8 g/dL (ref 13.0–17.0)
Lymphocytes Relative: 13 % (ref 12–46)
Lymphs Abs: 0.9 10*3/uL (ref 0.7–4.0)
MCH: 30.3 pg (ref 26.0–34.0)
MCHC: 35 g/dL (ref 30.0–36.0)
MCV: 86.6 fL (ref 78.0–100.0)
Monocytes Absolute: 0.7 10*3/uL (ref 0.1–1.0)
Monocytes Relative: 10 % (ref 3–12)
Neutro Abs: 5.2 10*3/uL (ref 1.7–7.7)
Neutrophils Relative %: 77 % (ref 43–77)
Platelets: 105 10*3/uL — ABNORMAL LOW (ref 150–400)
RBC: 5.54 MIL/uL (ref 4.22–5.81)
RDW: 12 % (ref 11.5–15.5)
WBC: 6.7 10*3/uL (ref 4.0–10.5)

## 2012-09-24 LAB — BASIC METABOLIC PANEL
BUN: 14 mg/dL (ref 6–23)
CO2: 27 mEq/L (ref 19–32)
Calcium: 9.2 mg/dL (ref 8.4–10.5)
Chloride: 101 mEq/L (ref 96–112)
Creatinine, Ser: 1.01 mg/dL (ref 0.50–1.35)
GFR calc Af Amer: >90 mL/min (ref >90)
GFR calc non Af Amer: >90 mL/min (ref >90)
Glucose, Bld: 107 mg/dL — ABNORMAL HIGH (ref 70–99)
Potassium: 3.5 mEq/L (ref 3.5–5.1)
Sodium: 138 mEq/L (ref 135–145)

## 2012-09-24 MED ORDER — TRAZODONE HCL 50 MG PO TABS
25.0000 mg | ORAL_TABLET | Freq: Every evening | ORAL | Status: DC | PRN
  Filled 2012-09-24: qty 1

## 2012-09-24 MED ORDER — VANCOMYCIN HCL IN DEXTROSE 1-5 GM/200ML-% IV SOLN
INTRAVENOUS | Status: AC
  Filled 2012-09-24: qty 200

## 2012-09-24 MED ORDER — ALUM & MAG HYDROXIDE-SIMETH 200-200-20 MG/5ML PO SUSP
30.0000 mL | Freq: Four times a day (QID) | ORAL | Status: DC | PRN
  Administered 2012-09-25: 30 mL via ORAL
  Filled 2012-09-24: qty 30

## 2012-09-24 MED ORDER — POTASSIUM CHLORIDE IN NACL 20-0.9 MEQ/L-% IV SOLN
INTRAVENOUS | Status: DC
  Administered 2012-09-24 (×2): via INTRAVENOUS
  Filled 2012-09-24: qty 1000

## 2012-09-24 MED ORDER — ACETAMINOPHEN 325 MG PO TABS: 650.0000 mg | ORAL_TABLET | Freq: Four times a day (QID) | ORAL | Status: DC | PRN

## 2012-09-24 MED ORDER — GUAIFENESIN-DM 100-10 MG/5ML PO SYRP: 5.0000 mL | ORAL_SOLUTION | ORAL | Status: DC | PRN

## 2012-09-24 MED ORDER — ONDANSETRON HCL 4 MG/2ML IJ SOLN: 4.0000 mg | Freq: Four times a day (QID) | INTRAMUSCULAR | Status: DC | PRN

## 2012-09-24 MED ORDER — VANCOMYCIN HCL IN DEXTROSE 1-5 GM/200ML-% IV SOLN
1000.0000 mg | Freq: Once | INTRAVENOUS | Status: AC
  Administered 2012-09-24: 1000 mg via INTRAVENOUS
  Filled 2012-09-24: qty 200

## 2012-09-24 MED ORDER — ACETAMINOPHEN 650 MG RE SUPP: 650.0000 mg | Freq: Four times a day (QID) | RECTAL | Status: DC | PRN

## 2012-09-24 MED ORDER — ONDANSETRON HCL 4 MG PO TABS: 4.0000 mg | ORAL_TABLET | Freq: Four times a day (QID) | ORAL | Status: DC | PRN

## 2012-09-24 MED ORDER — VANCOMYCIN HCL IN DEXTROSE 1-5 GM/200ML-% IV SOLN
1000.0000 mg | Freq: Three times a day (TID) | INTRAVENOUS | Status: DC
  Administered 2012-09-24 – 2012-09-25 (×3): 1000 mg via INTRAVENOUS
  Filled 2012-09-24 (×5): qty 200

## 2012-09-24 MED ORDER — OXYCODONE HCL 5 MG PO TABS
5.0000 mg | ORAL_TABLET | ORAL | Status: DC | PRN
  Administered 2012-09-24 – 2012-09-25 (×3): 5 mg via ORAL
  Filled 2012-09-24 (×3): qty 1

## 2012-09-24 MED ORDER — LEVOFLOXACIN 750 MG PO TABS
750.0000 mg | ORAL_TABLET | Freq: Every day | ORAL | Status: DC
  Administered 2012-09-24 – 2012-09-25 (×2): 750 mg via ORAL
  Filled 2012-09-24 (×2): qty 1

## 2012-09-24 NOTE — ED Notes (Signed)
Just finished eating dinner, pain free at this time. Attempted to call report to floor, nurse busy with shift change, will attempt again in a few minutes

## 2012-09-24 NOTE — ED Provider Notes (Signed)
History  This chart was scribed for Donnetta Hutching, MD, by Yevette Edwards, ED Scribe. This patient was seen in room [facility]08[facility]08 and the patient's care was started at 4:37 PM.  CSN: [id number]  Arrival date & time 09/24/12  1545   First MD Initiated Contact with Patient 09/24/12 1601      Chief Complaint  Patient presents with  . Leg Pain    (Consider location/radiation/quality/duration/timing/severity/associated sxs/prior treatment) The history is provided by the patient. No language interpreter was used.   HPI Comments: Robert Ferguson is a 37 y.o. male who presents to the Emergency Department complaining of a gradual onset, constant rash on his right leg which began yesterday. He reports that the rash began on his right upper thigh and then radiated to his lower right extremity and right foot. The pt a h/o cellulitis and MRSA. He denies a h/o diabetes. He admits to smoking daily and to occasionally drinking alcohol.   Past Medical History  Diagnosis Date  . MRSA infection     History reviewed. No pertinent past surgical history.  No family history on file.  History  Substance Use Topics  . Smoking status: Current Some Day Smoker    Types: Cigarettes  . Smokeless tobacco: Not on file  . Alcohol Use: Yes     Comment: Occ    Review of Systems A complete 10 system review of systems was obtained, and all systems are negative except where mentioned in the HPI and PE.  Allergies  Tramadol  Home Medications  No current outpatient prescriptions on file.  Triage Vitals: BP 140/67  Pulse 78  Temp(Src) 98.7 F (37.1 C) (Oral)  Resp 18  Ht 6' (1.829 m)  Wt 195 lb (88.451 kg)  BMI 26.44 kg/m2  SpO2 100%  Physical Exam  Nursing note and vitals reviewed. Constitutional: He is oriented to person, place, and time. He appears well-developed and well-nourished.  HENT:  Head: Normocephalic and atraumatic.  Eyes: Conjunctivae and EOM are normal. Pupils are equal, round, and  reactive to light.  Neck: Normal range of motion. Neck supple.  Cardiovascular: Normal rate, regular rhythm and normal heart sounds.   Pulmonary/Chest: Effort normal and breath sounds normal.  Abdominal: Soft. Bowel sounds are normal.  Musculoskeletal: Normal range of motion.  Neurological: He is alert and oriented to person, place, and time.  Skin: Skin is warm and dry.  Proximal, medial right thigh has a 5 cm diameter erythematous, macular rash. Medial mid-shaft right tibia has a 3 x 12 cm erythematous, macular rash. Entire dorsum of right foot has a erythematous, macular rash.  Psychiatric: He has a normal mood and affect.    ED Course  Procedures (including critical care time)   COORDINATION OF CARE:  5:06 PM- Discussed treatment plan with pt which includes blood work and IV antibiotics.   DIAGNOSTIC STUDIES: Oxygen Saturation is 100% on room air, normal by my interpretation.    Labs Reviewed  BASIC METABOLIC PANEL - Abnormal; Notable for the following:    Glucose, Bld 107 (*)    All other components within normal limits  CBC WITH DIFFERENTIAL - Abnormal; Notable for the following:    Platelets 105 (*)    All other components within normal limits   No results found.   No diagnosis found.    MDM  Patient has rapidly progressing erythematous macular rash consistent with cellulitis which started on his right thigh and has progressed to the dorsum of the right foot.  White count and glucose normal.  IV vancomycin. Admit    I personally performed the services described in this documentation, which was scribed in my presence. The recorded information has been reviewed and is accurate.       Donnetta Hutching, MD 09/24/12 [phone]

## 2012-09-24 NOTE — Progress Notes (Signed)
ANTIBIOTIC CONSULT NOTE - INITIAL  Pharmacy Consult for Vancomycin Indication: cellulitis  Allergies  Allergen Reactions  . Tramadol Nausea Only    Patient Measurements: Height: 6' (182.9 cm) Weight: 197 lb 3.2 oz (89.449 kg) IBW/kg (Calculated) : 77.6  Vital Signs: Temp: 98.7 F (37.1 C) (06/21 2005) Temp src: Oral (06/21 2005) BP: 137/84 mmHg (06/21 2005) Pulse Rate: 79 (06/21 2005) Intake/Output from previous day:   Intake/Output from this shift:    Labs:  Recent Labs  09/24/12 1701  WBC 6.7  HGB 16.8  PLT 105*  CREATININE 1.01   Estimated Creatinine Clearance: 109.9 ml/min (by C-G formula based on Cr of 1.01). No results found for this basename: VANCOTROUGH, VANCOPEAK, VANCORANDOM, GENTTROUGH, GENTPEAK, GENTRANDOM, TOBRATROUGH, TOBRAPEAK, TOBRARND, AMIKACINPEAK, AMIKACINTROU, AMIKACIN,  in the last 72 hours   Microbiology: No results found for this or any previous visit (from the past 720 hour(s)).  Medical History: Past Medical History  Diagnosis Date  . MRSA infection   . Cellulitis of left leg 2008  . Back pain     Medications:  Scheduled:  . levofloxacin  750 mg Oral Daily   Assessment: 37 yo M admitted with cellulitis of RLE.   He has a history of MRSA abscess/cellulitis of left knee in 2012.  He was started on IV Vancomycin & oral Levaquin. Renal function is at patient's baseline.    Goal of Therapy:  Vancomycin trough level 10-15 mcg/ml  Plan:  1) Vancomycin 1gm IV q8h 2) Check Vancomycin trough at steady state 3) Monitor renal function and cx data   Robert Ferguson 09/24/2012,10:04 PM

## 2012-09-24 NOTE — H&P (Signed)
[facility] History and Physical  Robert Ferguson Robert Ferguson:[id number] DOB: September 30, 1975 DOA: 09/24/2012  Referring physician: Dr. Adriana Simas PCP: [name], MD  Specialists: None  Chief Complaint: Right leg pain and redness  HPI: Robert Ferguson is a 37 y.o. male with a past medical history significant for MRSA abscess/ cellulitis, who presents to the emergency department with a chief complaint of right leg redness and pain. He noticed redness and discomfort initially on his right thigh yesterday. Since that time, the redness has spread to his right calf area and right foot. No open wounds or drainage. He does not recall being bitten by any insects. He denies trauma. He had subjective fever and chills yesterday. He has also had generalized fatigue. He denies chest pain, pleurisy, nausea, vomiting, diarrhea, or abdominal pain.  In the emergency department, he is afebrile and hemodynamically stable. His lab data are significant for platelet count of 105. He is being admitted for further evaluation and management.  Review of Systems: As above in history present illness. Otherwise negative.  Past Medical History  Diagnosis Date  . MRSA infection   . Cellulitis of left leg 2008  . Back pain    History reviewed. No pertinent past surgical history. Social History: He is divorced. He has 2 sons. He is employed. He drinks beer 3-4 days weekly. He generally smokes 1-2 cigarettes while drinking beer 3-4 days a week. He denies illicit drug use.  Allergies  Allergen Reactions  . Tramadol Nausea Only    Family history: His mother and father are both living and had no chronic medical conditions.  Prior to Admission medications   Not on File   Physical Exam: Filed Vitals:   09/24/12 1556  BP: 140/67  Pulse: 78  Temp: 98.7 F (37.1 C)  TempSrc: Oral  Resp: 18  Height: 6' (1.829 m)  Weight: 88.451 kg (195 lb)  SpO2: 100%     General:  Well-nourished alert 37 year old Caucasian man laying in  bed, in no acute distress.  Eyes: Pupils are equal, round, and reactive to light. Extraocular movements are intact. Conjunctivae are clear. Sclerae are white.  ENT: Oropharynx reveals mildly dry mucous membranes.  Neck: Supple, no adenopathy, no thyromegaly, no JVD.  Cardiovascular: S1, S2, with no murmurs rubs or gallops.  Respiratory: Clear to auscultation bilaterally.  Abdomen: Positive bowel sounds, soft, nontender, nondistended.  Skin: Multiple tattoos on his arms noted. A patch of erythema of his medial right thigh, erythema of the medial calf area, and global erythema/warmth and 1+ edema over the dorsum of his right foot. Pedal pulses palpable bilaterally. Right dorsum moderately tender. No edema or tenderness on the left lower extremity.  Musculoskeletal: Right foot with global erythema and edema, otherwise no other acute hot red joints.  Psychiatric: Pleasant affect.  Neurologic: Alert and oriented x3. Cranial nerves II through XII are intact.  Labs on Admission:  Basic Metabolic Panel:  Recent Labs Lab 09/24/12 1701  NA 138  K 3.5  CL 101  CO2 27  GLUCOSE 107*  BUN 14  CREATININE 1.01  CALCIUM 9.2   Liver Function Tests: No results found for this basename: AST, ALT, ALKPHOS, BILITOT, PROT, ALBUMIN,  in the last 168 hours No results found for this basename: LIPASE, AMYLASE,  in the last 168 hours No results found for this basename: AMMONIA,  in the last 168 hours CBC:  Recent Labs Lab 09/24/12 1701  WBC 6.7  NEUTROABS 5.2  HGB 16.8  HCT 48.0  MCV 86.6  PLT 105*   Cardiac Enzymes: No results found for this basename: CKTOTAL, CKMB, CKMBINDEX, TROPONINI,  in the last 168 hours  BNP (last 3 results) No results found for this basename: PROBNP,  in the last 8760 hours CBG: No results found for this basename: GLUCAP,  in the last 168 hours  Radiological Exams on Admission: No results found.  EKG:   Assessment/Plan Principal Problem:   Cellulitis  of right lower extremity Active Problems:   Thrombocytopenia   The patient will be admitted for treatment of right lower extremity cellulitis. He reports a history of MRSA cellulitis/abscess of his left knee in 2012 and generalized cellulitis of his left lower extremity in 2008. They were both treated successfully with antibiotics, but the latter required hospitalization. He was given vancomycin in the emergency department. We'll continue vancomycin and add oral Levaquin. Etiology of his thrombocytopenia is unclear, possibly secondary to the infection. Will assess his thyroid function and vitamin B12 level. Will hold on Lovenox or heparin for DVT prophylaxis because of the thrombocytopenia and because it is likely he will have early ambulation. Nevertheless, will place SCDs that will help with the edema of his right lower extremity. The patient was encouraged to stop smoking completely.    Code Status: Full code Family Communication: None available Disposition Plan: Anticipate discharge to home in 48 hours.  Time spent: 45 minutes  [facility] Pager [phone]  If 7PM-7AM, please contact night-coverage [email or web address] Password [facility] 09/24/2012, 6:28 PM

## 2012-09-24 NOTE — ED Notes (Signed)
Redness to upper right leg yesterday.  States woke up this morning with red streak down right leg and right foot red and swollen.  C/o pain.

## 2012-09-25 DIAGNOSIS — L02419 Cutaneous abscess of limb, unspecified: Secondary | ICD-10-CM

## 2012-09-25 DIAGNOSIS — D696 Thrombocytopenia, unspecified: Secondary | ICD-10-CM

## 2012-09-25 DIAGNOSIS — L03119 Cellulitis of unspecified part of limb: Secondary | ICD-10-CM

## 2012-09-25 LAB — CBC
HCT: 46.6 % (ref 39.0–52.0)
Hemoglobin: 16.3 g/dL (ref 13.0–17.0)
MCH: 30.6 pg (ref 26.0–34.0)
MCHC: 35 g/dL (ref 30.0–36.0)
MCV: 87.4 fL (ref 78.0–100.0)
Platelets: 101 10*3/uL — ABNORMAL LOW (ref 150–400)
RBC: 5.33 MIL/uL (ref 4.22–5.81)
RDW: 12 % (ref 11.5–15.5)
WBC: 5.8 10*3/uL (ref 4.0–10.5)

## 2012-09-25 LAB — COMPREHENSIVE METABOLIC PANEL
ALT: 48 U/L (ref 0–53)
AST: 28 U/L (ref 0–37)
Albumin: 3.1 g/dL — ABNORMAL LOW (ref 3.5–5.2)
Alkaline Phosphatase: 76 U/L (ref 39–117)
BUN: 14 mg/dL (ref 6–23)
CO2: 29 mEq/L (ref 19–32)
Calcium: 8.7 mg/dL (ref 8.4–10.5)
Chloride: 102 mEq/L (ref 96–112)
Creatinine, Ser: 1 mg/dL (ref 0.50–1.35)
GFR calc Af Amer: >90 mL/min (ref >90)
GFR calc non Af Amer: >90 mL/min (ref >90)
Glucose, Bld: 119 mg/dL — ABNORMAL HIGH (ref 70–99)
Potassium: 3.5 mEq/L (ref 3.5–5.1)
Sodium: 138 mEq/L (ref 135–145)
Total Bilirubin: 0.3 mg/dL (ref 0.3–1.2)
Total Protein: 6.6 g/dL (ref 6.0–8.3)

## 2012-09-25 LAB — VITAMIN B12: Vitamin B-12: 340 pg/mL (ref 211–911)

## 2012-09-25 LAB — TSH: TSH: 0.522 u[IU]/mL (ref 0.350–4.500)

## 2012-09-25 MED ORDER — LEVOFLOXACIN 750 MG PO TABS: 750.0000 mg | ORAL_TABLET | Freq: Every day | ORAL | Status: DC

## 2012-09-25 MED ORDER — OXYCODONE-ACETAMINOPHEN 2.5-325 MG PO TABS: 1.0000 | ORAL_TABLET | ORAL | Status: DC | PRN

## 2012-09-25 NOTE — Discharge Summary (Signed)
Physician Discharge Summary  Robert Ferguson ZOX:[id number] DOB: April 17, 1975 DOA: 09/24/2012  PCP: [name], MD  Admit date: 09/24/2012 Discharge date: 09/26/2012  Time spent: Less than 30 minutes  Recommendations for Outpatient Follow-up:  1. The patient was advised to followup with Dr. [name] as needed or within ED if his cellulitis worsens or returns. 2. His platelet count will need to be reassessed at his followup appointment.  Discharge Diagnoses:  Principal Problem:   Cellulitis of right lower extremity Active Problems:   Thrombocytopenia   Discharge Condition: Improved.  Diet recommendation: Regular as tolerated  Filed Weights   09/24/12 1556 09/24/12 2005  Weight: 88.451 kg (195 lb) 89.449 kg (197 lb 3.2 oz)    History of present illness:  The patient is a 37 year old man with a past medical history significant for MRSA abscess/cellulitis who presented to the emergency department on 09/24/2012 with a chief complaint of right leg redness and pain. He denied trauma or any known insect bites. He had subjective fever and chills once. In the emergency department, he was afebrile and hemodynamically stable. His lab data were significant for platelet count of 105. He was admitted for further evaluation and management.   Hospital Course:  The patient was started on IV fluid hydration. He was given 1 g of vancomycin in the emergency department. He was subsequently started on vancomycin as dosed per pharmacy. During the hospitalization, he received at least 3 doses of IV vancomycin. In addition, Levaquin was started orally for gram negative in addition to gram-positive coverage. He received 2 full doses. Over the course of the hospitalization, the erythema and edema over his right foot subsided substantially. The erythema over his right calf resolved completely. He still had minimal erythema of his right thigh. Tenderness and warmth subsided. He remained afebrile and hemodynamically stable.  For evaluation of his thrombocytopenia, TSH and vitamin B12 level were ordered. The results were pending at the time of discharge. He was informed that the dictating physician would call him if the results were abnormal. He was instructed to followup with his primary care physician Dr. [name] for followup of his cellulitis and thrombocytopenia.   The patient drinks beer several days weekly. This appears to be believable as there was no evidence of alcohol abuse.  He also smokes several times weekly. He was encouraged to stop smoking altogether.  He was discharged on 7 more days of Levaquin at 750 mg daily.  Procedures:  None   Consultations:  None   Discharge Exam: Filed Vitals:   09/24/12 1840 09/24/12 2005 09/25/12 0641 09/25/12 1429  BP: 119/71 137/84 141/84 149/77  Pulse: 66 79 61 55  Temp:  98.7 F (37.1 C) 98.1 F (36.7 C) 97.5 F (36.4 C)  TempSrc:  Oral Oral Oral  Resp: 20 20 20 20   Height:  6' (1.829 m)    Weight:  89.449 kg (197 lb 3.2 oz)    SpO2: 98% 98% 100% 99%    General: no acute distress  Cardiovascular: S1, S2, with borderline bradycardia.  Respiratory: clear to auscultation bilaterally. Skin/extremities: Small patch of erythema proximal right thigh, resolution of right calf erythema, and significantly less edema and erythema over the right dorsum. Trace of erythema and edema of right foot. Nontender.  Discharge Instructions      Discharge Orders   Future Orders Complete By Expires     Diet general  As directed     Discharge instructions  As directed     Comments:  Follow up with your primary doctor in 1 week to have your platelet count rechecked.    Increase activity slowly  As directed         Medication List    TAKE these medications       levofloxacin 750 MG tablet  Commonly known as:  LEVAQUIN  Take 1 tablet (750 mg total) by mouth daily.     oxycodone-acetaminophen 2.5-325 MG per tablet  Commonly known as:  PERCOCET  Take 1 tablet  by mouth every 4 (four) hours as needed for pain.       Allergies  Allergen Reactions  . Tramadol Nausea Only   Follow-up Information   Follow up with [name], MD. Schedule an appointment as soon as possible for a visit in 1 week.   Contact information:   [address] [place] [address] [phone]        The results of significant diagnostics from this hospitalization (including imaging, microbiology, ancillary and laboratory) are listed below for reference.    Significant Diagnostic Studies: No results found.  Microbiology: No results found for this or any previous visit (from the past 240 hour(s)).   Labs: Basic Metabolic Panel:  Recent Labs Lab 09/24/12 1701 09/25/12 0611  NA 138 138  K 3.5 3.5  CL 101 102  CO2 27 29  GLUCOSE 107* 119*  BUN 14 14  CREATININE 1.01 1.00  CALCIUM 9.2 8.7   Liver Function Tests:  Recent Labs Lab 09/25/12 0611  AST 28  ALT 48  ALKPHOS 76  BILITOT 0.3  PROT 6.6  ALBUMIN 3.1*   No results found for this basename: LIPASE, AMYLASE,  in the last 168 hours No results found for this basename: AMMONIA,  in the last 168 hours CBC:  Recent Labs Lab 09/24/12 1701 09/25/12 0609  WBC 6.7 5.8  NEUTROABS 5.2  --   HGB 16.8 16.3  HCT 48.0 46.6  MCV 86.6 87.4  PLT 105* 101*   Cardiac Enzymes: No results found for this basename: CKTOTAL, CKMB, CKMBINDEX, TROPONINI,  in the last 168 hours BNP: BNP (last 3 results) No results found for this basename: PROBNP,  in the last 8760 hours CBG: No results found for this basename: GLUCAP,  in the last 168 hours     Signed:  Dontell Mian  [facility] 09/26/2012, 2:24 PM

## 2012-09-25 NOTE — Progress Notes (Signed)
Patient states understanding of discharge instructions, prescriptions given 

## 2012-09-27 NOTE — Progress Notes (Signed)
UR Chart Review Completed  

## 2013-11-14 ENCOUNTER — Ambulatory Visit (INDEPENDENT_AMBULATORY_CARE_PROVIDER_SITE_OTHER): Payer: PRIVATE HEALTH INSURANCE | Admitting: Family Medicine

## 2013-11-14 ENCOUNTER — Encounter (INDEPENDENT_AMBULATORY_CARE_PROVIDER_SITE_OTHER): Payer: Self-pay

## 2013-11-14 VITALS — BP 136/82 | HR 65 | Temp 98.6°F | Ht 72.0 in | Wt 215.0 lb

## 2013-11-14 DIAGNOSIS — R609 Edema, unspecified: Secondary | ICD-10-CM

## 2013-11-14 DIAGNOSIS — F988 Other specified behavioral and emotional disorders with onset usually occurring in childhood and adolescence: Secondary | ICD-10-CM

## 2013-11-14 MED ORDER — LISDEXAMFETAMINE DIMESYLATE 50 MG PO CAPS: 50.0000 mg | ORAL_CAPSULE | Freq: Every day | ORAL | Status: DC

## 2013-11-14 MED ORDER — LISDEXAMFETAMINE DIMESYLATE 50 MG PO CAPS
50.0000 mg | ORAL_CAPSULE | Freq: Every day | ORAL | Status: DC
Start: 2013-11-14 — End: 2016-06-28

## 2013-11-14 NOTE — Progress Notes (Signed)
   Subjective:    Patient ID: Robert HeraldJames M Acevedo, male    DOB: 05/18/1975, 38 y.o.   MRN: [id number]  HPI C/o anxiety and he states he has attention problems.  He has been having some swelling in his  Right foot.  He has been dx with ADD when he was a child.  He has not been on ADD medicine in Awhile.  He has had MRSA cellulitis of the right foot and ankle and has had edema since but the infection is gone   Review of Systems C/o edema and decreased attention   No chest pain, SOB, HA, dizziness, vision change, N/V, diarrhea, constipation, dysuria, urinary urgency or frequency, myalgias, arthralgias or rash.  Objective:   Physical Exam Vital signs noted  Well developed well nourished male.  HEENT - Head atraumatic Normocephalic                Eyes - PERRLA, Conjuctiva - clear Sclera- Clear EOMI                Ears - EAC's Wnl TM's Wnl Gross Hearing WNL                Nose - Nares patent                 Throat - oropharanx wnl Respiratory - Lungs CTA bilateral Cardiac - RRR S1 and S2 without murmur GI - Abdomen soft Nontender and bowel sounds active x 4 Extremities - Right foot with general edema. Neuro - Grossly intact.       Assessment & Plan:  ADD (attention deficit disorder) - Plan: lisdexamfetamine (VYVANSE) 50 MG capsule, DISCONTINUED: lisdexamfetamine (VYVANSE) 50 MG capsule  Edema - Venous compression stocking bilateral with medium compression  Follow  Up in 3 months Deatra CanterWilliam J Kerline Trahan FNP

## 2013-11-15 ENCOUNTER — Other Ambulatory Visit: Payer: Self-pay | Admitting: Family Medicine

## 2013-11-15 MED ORDER — AMPHETAMINE-DEXTROAMPHETAMINE 20 MG PO TABS: 20.0000 mg | ORAL_TABLET | Freq: Every day | ORAL | Status: DC

## 2014-02-20 ENCOUNTER — Emergency Department (HOSPITAL_COMMUNITY)
Admission: EM | Admit: 2014-02-20 | Discharge: 2014-02-21 | Disposition: A | Discharge status: Home / Self Care | Payer: Medicaid Other | Attending: Emergency Medicine | Admitting: Emergency Medicine

## 2014-02-20 ENCOUNTER — Encounter (HOSPITAL_COMMUNITY): Payer: Self-pay

## 2014-02-20 ENCOUNTER — Emergency Department (HOSPITAL_COMMUNITY): Payer: Medicaid Other

## 2014-02-20 DIAGNOSIS — L03115 Cellulitis of right lower limb: Secondary | ICD-10-CM | POA: Insufficient documentation

## 2014-02-20 DIAGNOSIS — M542 Cervicalgia: Secondary | ICD-10-CM | POA: Insufficient documentation

## 2014-02-20 DIAGNOSIS — Z8614 Personal history of Methicillin resistant Staphylococcus aureus infection: Secondary | ICD-10-CM | POA: Insufficient documentation

## 2014-02-20 DIAGNOSIS — D696 Thrombocytopenia, unspecified: Secondary | ICD-10-CM | POA: Insufficient documentation

## 2014-02-20 DIAGNOSIS — Z720 Tobacco use: Secondary | ICD-10-CM | POA: Insufficient documentation

## 2014-02-20 DIAGNOSIS — R002 Palpitations: Secondary | ICD-10-CM | POA: Insufficient documentation

## 2014-02-20 DIAGNOSIS — Z79899 Other long term (current) drug therapy: Secondary | ICD-10-CM | POA: Insufficient documentation

## 2014-02-20 DIAGNOSIS — R509 Fever, unspecified: Secondary | ICD-10-CM

## 2014-02-20 MED ORDER — KETOROLAC TROMETHAMINE 30 MG/ML IJ SOLN
30.0000 mg | Freq: Once | INTRAMUSCULAR | Status: AC
  Administered 2014-02-21: 30 mg via INTRAVENOUS
  Filled 2014-02-20: qty 1

## 2014-02-20 MED ORDER — SODIUM CHLORIDE 0.9 % IV SOLN
1000.0000 mL | Freq: Once | INTRAVENOUS | Status: AC
  Administered 2014-02-21: 1000 mL via INTRAVENOUS

## 2014-02-20 MED ORDER — SODIUM CHLORIDE 0.9 % IV SOLN
1000.0000 mL | INTRAVENOUS | Status: DC
Start: 2014-02-20 — End: 2014-02-21
  Administered 2014-02-21: 1000 mL via INTRAVENOUS

## 2014-02-20 NOTE — ED Notes (Signed)
MD at bedside. 

## 2014-02-20 NOTE — ED Provider Notes (Signed)
CSN: [id number]     Arrival date & time 02/20/14  2316 History  This chart was scribed for Robert Boozeavid September Mormile, MD by Milly JakobJohn Lee Graves, ED Scribe. The patient was seen in room APA06/APA06. Patient's care was started at 11:33 PM.    Chief Complaint  Patient presents with  . Palpitations  . Fever   The history is provided by the patient. No language interpreter was used.   HPI Comments: Carlynn HeraldJames M Varricchio is a 38 y.o. male who presents to the Emergency Department due to a fever of max temp 100 at home. He also states that he has right sided chest "fluttering" which began earlier today. He additionally reports constant, severe, aching pain in his neck which began 2 weeks ago, and is exacerbated by turning his head. He reports lesser pain in his right foot since he "rolled it" 2.5 weeks ago, which is exacerbated by palpation. He reports taking Tylenol and Advil at home with minimal relief. He has a new tattoo on his left arm from last week. He denies rhinorrhea, sore throat, swollen lymph nodes, cough, nausea, vomiting, or dysuria. He denies having a flu shot this year.   XBJ:YNWGNPCP:MOORE, Dorinda HillNALD, MD   Past Medical History  Diagnosis Date  . MRSA infection   . Cellulitis of left leg 2008  . Back pain    History reviewed. No pertinent past surgical history. No family history on file. History  Substance Use Topics  . Smoking status: Current Some Day Smoker -- 0.25 packs/day for 15 years    Types: Cigarettes  . Smokeless tobacco: Current User     Comment: 0.25ppw - refused nicotine  . Alcohol Use: Yes     Comment: Occ    Review of Systems  Constitutional: Positive for fever.  Musculoskeletal: Positive for arthralgias (right foot) and neck pain.  All other systems reviewed and are negative.   Allergies  Tramadol  Home Medications   Prior to Admission medications   Medication Sig Start Date End Date Taking? Authorizing Provider  amphetamine-dextroamphetamine (ADDERALL) 20 MG tablet Take 1 tablet (20  mg total) by mouth daily. 11/15/13   Deatra CanterWilliam J Oxford, FNP  lisdexamfetamine (VYVANSE) 50 MG capsule Take 1 capsule (50 mg total) by mouth daily. 11/14/13   Deatra CanterWilliam J Oxford, FNP  lisdexamfetamine (VYVANSE) 50 MG capsule Take 1 capsule (50 mg total) by mouth daily. 11/14/13   Deatra CanterWilliam J Oxford, FNP   Triage Vitals: BP 151/87 mmHg  Pulse 113  Temp(Src) 102.3 F (39.1 C) (Oral)  Resp 22  Ht 6' (1.829 m)  Wt 220 lb (99.791 kg)  BMI 29.83 kg/m2  SpO2 95% Physical Exam  Constitutional: He is oriented to person, place, and time. He appears well-developed and well-nourished. No distress.  Non-toxic appearance.   HENT:  Head: Normocephalic and atraumatic.  Mouth/Throat: Oropharynx is clear and moist.  Eyes: Conjunctivae and EOM are normal. Pupils are equal, round, and reactive to light.  Neck: Neck supple. No JVD present. No tracheal deviation present.  Tender along right para cervical muscles.   Cardiovascular: Normal rate, regular rhythm and normal heart sounds.   No murmur heard. Pulmonary/Chest: Effort normal and breath sounds normal. No respiratory distress. He has no wheezes. He has no rales.  Abdominal: Soft. Bowel sounds are normal. He exhibits no mass. There is no tenderness. There is no rebound and no guarding.  Musculoskeletal: Normal range of motion. He exhibits no edema.  1+ pedal edema right foot. Erythema, warmth, and tenderness, dorsum  of the right foot. No lymphangitic  streaks. Recent tattoo on the left upper arm has mild warmth, no erythema or swelling.   Lymphadenopathy:    He has no cervical adenopathy.  Neurological: He is alert and oriented to person, place, and time. No cranial nerve deficit. Coordination normal.  Skin: Skin is warm and dry.  Psychiatric: He has a normal mood and affect. His behavior is normal. Thought content normal.  Nursing note and vitals reviewed.   ED Course  Procedures (including critical care time) DIAGNOSTIC STUDIES: Oxygen Saturation is  95% on room air, adequate by my interpretation.    COORDINATION OF CARE: 11:42 PM-Discussed treatment plan which includes blood work, UA, EKG, CXR, nausea medication, and pain medication with pt at bedside and pt agreed to plan.   Labs Review Results for orders placed or performed during the hospital encounter of 02/20/14  Culture, blood (routine x 2)  Result Value Ref Range   Specimen Description BLOOD RIGHT ANTECUBITAL    Special Requests BOTTLES DRAWN AEROBIC AND ANAEROBIC [facility] EACH    Culture PENDING    Report Status PENDING   Culture, blood (routine x 2)  Result Value Ref Range   Specimen Description BLOOD RIGHT HAND    Special Requests      BOTTLES DRAWN AEROBIC AND ANAEROBIC AEB 8CC ANA 6CC   Culture PENDING    Report Status PENDING   CBC with Differential  Result Value Ref Range   WBC 14.5 (H) 4.0 - 10.5 K/uL   RBC 5.79 4.22 - 5.81 MIL/uL   Hemoglobin 17.7 (H) 13.0 - 17.0 g/dL   HCT 04.550.4 40.939.0 - 81.152.0 %   MCV 87.0 78.0 - 100.0 fL   MCH 30.6 26.0 - 34.0 pg   MCHC 35.1 30.0 - 36.0 g/dL   RDW 91.412.0 78.211.5 - 95.615.5 %   Platelets 126 (L) 150 - 400 K/uL   Neutrophils Relative % 91 (H) 43 - 77 %   Neutro Abs 13.1 (H) 1.7 - 7.7 K/uL   Lymphocytes Relative 4 (L) 12 - 46 %   Lymphs Abs 0.6 (L) 0.7 - 4.0 K/uL   Monocytes Relative 5 3 - 12 %   Monocytes Absolute 0.8 0.1 - 1.0 K/uL   Eosinophils Relative 0 0 - 5 %   Eosinophils Absolute 0.0 0.0 - 0.7 K/uL   Basophils Relative 0 0 - 1 %   Basophils Absolute 0.0 0.0 - 0.1 K/uL  Comprehensive metabolic panel  Result Value Ref Range   Sodium 137 137 - 147 mEq/L   Potassium 4.4 3.7 - 5.3 mEq/L   Chloride 97 96 - 112 mEq/L   CO2 27 19 - 32 mEq/L   Glucose, Bld 102 (H) 70 - 99 mg/dL   BUN 11 6 - 23 mg/dL   Creatinine, Ser 2.131.03 0.50 - 1.35 mg/dL   Calcium 9.5 8.4 - 08.610.5 mg/dL   Total Protein 7.6 6.0 - 8.3 g/dL   Albumin 4.2 3.5 - 5.2 g/dL   AST 39 (H) 0 - 37 U/L   ALT 62 (H) 0 - 53 U/L   Alkaline Phosphatase 100 39 - 117 U/L    Total Bilirubin 0.7 0.3 - 1.2 mg/dL   GFR calc non Af Amer >90 >90 mL/min   GFR calc Af Amer >90 >90 mL/min   Anion gap 13 5 - 15  Urinalysis, Routine w reflex microscopic  Result Value Ref Range   Color, Urine AMBER (A) YELLOW   APPearance CLEAR CLEAR  Specific Gravity, Urine 1.025 1.005 - 1.030   pH 5.5 5.0 - 8.0   Glucose, UA NEGATIVE NEGATIVE mg/dL   Hgb urine dipstick NEGATIVE NEGATIVE   Bilirubin Urine SMALL (A) NEGATIVE   Ketones, ur NEGATIVE NEGATIVE mg/dL   Protein, ur NEGATIVE NEGATIVE mg/dL   Urobilinogen, UA 0.2 0.0 - 1.0 mg/dL   Nitrite NEGATIVE NEGATIVE   Leukocytes, UA NEGATIVE NEGATIVE  Lactic acid, plasma  Result Value Ref Range   Lactic Acid, Venous 2.2 0.5 - 2.2 mmol/L    Imaging Review No results found.   EKG Interpretation   Date/Time:  Tuesday February 20 2014 23:32:02 EST Ventricular Rate:  106 PR Interval:  135 QRS Duration: 75 QT Interval:  297 QTC Calculation: 394 R Axis:   37 Text Interpretation:  Sinus tachycardia ST elev, probable normal early  repol pattern No old tracing to compare Confirmed by [facility]  MD, Kampbell Holaway  ([id number]) on 02/20/2014 11:49:00 PM      MDM   Final diagnoses:  Fever, unspecified fever cause  Cellulitis of right foot    Fever with most likely source being cellulitis of the right foot. Old records are reviewed and he had been treated as an inpatient for cellulitis in that same area. He has history of MRSA. Area around tattoo of left arm is only mildly warm and does not show sign of acute infection and I believe is only showing a normal reaction to dye. Septic workup is initiated including lactic acid and urinalysis and chest x-ray. All of these come back normal. Laboratory workup shows leukocytosis and mild thrombocytopenia. Mild elevation of transaminases as noted and probably not clinically significant. Patient does not appear toxic and is tolerating oral medication so he is safe to discharge. Because of history of MRSA,  he is given initial dose of vancomycin in the ED and is discharged with prescriptions for cephalexin and doxycycline which will cover both MRSA and non-MRSA. Prescription is given for oxycodone and acetaminophen for pain and he is to follow-up with his PCP in 2 days for recheck.  I personally performed the services described in this documentation, which was scribed in my presence. The recorded information has been reviewed and is accurate.     Robert Booze, MD 02/21/14 [phone]

## 2014-02-20 NOTE — ED Notes (Signed)
My neck and foot hurt from a fall. Having chest pain on the right side, chest was fluttering, and I have been running a fever. Patient recently had a new tattoo placed on left arm.

## 2014-02-21 LAB — COMPREHENSIVE METABOLIC PANEL
ALT: 62 U/L — ABNORMAL HIGH (ref 0–53)
AST: 39 U/L — ABNORMAL HIGH (ref 0–37)
Albumin: 4.2 g/dL (ref 3.5–5.2)
Alkaline Phosphatase: 100 U/L (ref 39–117)
Anion gap: 13 (ref 5–15)
BUN: 11 mg/dL (ref 6–23)
CO2: 27 mEq/L (ref 19–32)
Calcium: 9.5 mg/dL (ref 8.4–10.5)
Chloride: 97 mEq/L (ref 96–112)
Creatinine, Ser: 1.03 mg/dL (ref 0.50–1.35)
GFR calc Af Amer: >90 mL/min (ref >90)
GFR calc non Af Amer: >90 mL/min (ref >90)
Glucose, Bld: 102 mg/dL — ABNORMAL HIGH (ref 70–99)
Potassium: 4.4 mEq/L (ref 3.7–5.3)
Sodium: 137 mEq/L (ref 137–147)
Total Bilirubin: 0.7 mg/dL (ref 0.3–1.2)
Total Protein: 7.6 g/dL (ref 6.0–8.3)

## 2014-02-21 LAB — CBC WITH DIFFERENTIAL/PLATELET
Basophils Absolute: 0 10*3/uL (ref 0.0–0.1)
Basophils Relative: 0 % (ref 0–1)
Eosinophils Absolute: 0 10*3/uL (ref 0.0–0.7)
Eosinophils Relative: 0 % (ref 0–5)
HCT: 50.4 % (ref 39.0–52.0)
Hemoglobin: 17.7 g/dL — ABNORMAL HIGH (ref 13.0–17.0)
Lymphocytes Relative: 4 % — ABNORMAL LOW (ref 12–46)
Lymphs Abs: 0.6 10*3/uL — ABNORMAL LOW (ref 0.7–4.0)
MCH: 30.6 pg (ref 26.0–34.0)
MCHC: 35.1 g/dL (ref 30.0–36.0)
MCV: 87 fL (ref 78.0–100.0)
Monocytes Absolute: 0.8 10*3/uL (ref 0.1–1.0)
Monocytes Relative: 5 % (ref 3–12)
Neutro Abs: 13.1 10*3/uL — ABNORMAL HIGH (ref 1.7–7.7)
Neutrophils Relative %: 91 % — ABNORMAL HIGH (ref 43–77)
Platelets: 126 10*3/uL — ABNORMAL LOW (ref 150–400)
RBC: 5.79 MIL/uL (ref 4.22–5.81)
RDW: 12 % (ref 11.5–15.5)
WBC: 14.5 10*3/uL — ABNORMAL HIGH (ref 4.0–10.5)

## 2014-02-21 LAB — LACTIC ACID, PLASMA: Lactic Acid, Venous: 2.2 mmol/L (ref 0.5–2.2)

## 2014-02-21 LAB — URINALYSIS, ROUTINE W REFLEX MICROSCOPIC
Glucose, UA: NEGATIVE mg/dL
Hgb urine dipstick: NEGATIVE
Ketones, ur: NEGATIVE mg/dL
Leukocytes, UA: NEGATIVE
Nitrite: NEGATIVE
Protein, ur: NEGATIVE mg/dL
Specific Gravity, Urine: 1.025 (ref 1.005–1.030)
Urobilinogen, UA: 0.2 mg/dL (ref 0.0–1.0)
pH: 5.5 (ref 5.0–8.0)

## 2014-02-21 MED ORDER — CYCLOBENZAPRINE HCL 10 MG PO TABS
10.0000 mg | ORAL_TABLET | Freq: Once | ORAL | Status: AC
  Administered 2014-02-21: 10 mg via ORAL
  Filled 2014-02-21: qty 1

## 2014-02-21 MED ORDER — OXYCODONE-ACETAMINOPHEN 5-325 MG PO TABS: 1.0000 | ORAL_TABLET | ORAL | Status: DC | PRN

## 2014-02-21 MED ORDER — ONDANSETRON HCL 4 MG/2ML IJ SOLN
4.0000 mg | Freq: Once | INTRAMUSCULAR | Status: AC
  Administered 2014-02-21: 4 mg via INTRAVENOUS
  Filled 2014-02-21: qty 2

## 2014-02-21 MED ORDER — OXYCODONE-ACETAMINOPHEN 5-325 MG PO TABS
1.0000 | ORAL_TABLET | Freq: Once | ORAL | Status: AC
  Administered 2014-02-21: 1 via ORAL
  Filled 2014-02-21: qty 1

## 2014-02-21 MED ORDER — CEPHALEXIN 500 MG PO CAPS: 500.0000 mg | ORAL_CAPSULE | Freq: Four times a day (QID) | ORAL | Status: DC

## 2014-02-21 MED ORDER — DOXYCYCLINE HYCLATE 100 MG PO CAPS: 100.0000 mg | ORAL_CAPSULE | Freq: Two times a day (BID) | ORAL | Status: DC

## 2014-02-21 MED ORDER — VANCOMYCIN HCL IN DEXTROSE 1-5 GM/200ML-% IV SOLN
1000.0000 mg | Freq: Once | INTRAVENOUS | Status: AC
  Administered 2014-02-21: 1000 mg via INTRAVENOUS
  Filled 2014-02-21: qty 200

## 2014-02-21 NOTE — Discharge Instructions (Signed)
Cellulitis Cellulitis is an infection of the skin and the tissue beneath it. The infected area is usually red and tender. Cellulitis occurs most often in the arms and lower legs.  CAUSES  Cellulitis is caused by bacteria that enter the skin through cracks or cuts in the skin. The most common types of bacteria that cause cellulitis are staphylococci and streptococci. SIGNS AND SYMPTOMS   Redness and warmth.  Swelling.  Tenderness or pain.  Fever. DIAGNOSIS  Your health care provider can usually determine what is wrong based on a physical exam. Blood tests may also be done. TREATMENT  Treatment usually involves taking an antibiotic medicine. HOME CARE INSTRUCTIONS   Take your antibiotic medicine as directed by your health care provider. Finish the antibiotic even if you start to feel better.  Keep the infected arm or leg elevated to reduce swelling.  Apply a warm cloth to the affected area up to 4 times per day to relieve pain.  Take medicines only as directed by your health care provider.  Keep all follow-up visits as directed by your health care provider. SEEK MEDICAL CARE IF:   You notice red streaks coming from the infected area.  Your red area gets larger or turns dark in color.  Your bone or joint underneath the infected area becomes painful after the skin has healed.  Your infection returns in the same area or another area.  You notice a swollen bump in the infected area.  You develop new symptoms.  You have a fever. SEEK IMMEDIATE MEDICAL CARE IF:   You feel very sleepy.  You develop vomiting or diarrhea.  You have a general ill feeling (malaise) with muscle aches and pains. MAKE SURE YOU:   Understand these instructions.  Will watch your condition.  Will get help right away if you are not doing well or get worse. Document Released: 12/31/2004 Document Revised: 08/07/2013 Document Reviewed: 06/08/2011 [facility] Patient Information 2015 [place], [place].  This information is not intended to replace advice given to you by your health care provider. Make sure you discuss any questions you have with your health care provider.  Cephalexin tablets or capsules What is this medicine? CEPHALEXIN (sef a LEX in) is a cephalosporin antibiotic. It is used to treat certain kinds of bacterial infections It will not work for colds, flu, or other viral infections. This medicine may be used for other purposes; ask your health care provider or pharmacist if you have questions. COMMON BRAND NAME(S): Biocef, Keflex, Keftab What should I tell my health care provider before I take this medicine? They need to know if you have any of these conditions: -kidney disease -stomach or intestine problems, especially colitis -an unusual or allergic reaction to cephalexin, other cephalosporins, penicillins, other antibiotics, medicines, foods, dyes or preservatives -pregnant or trying to get pregnant -breast-feeding How should I use this medicine? Take this medicine by mouth with a full glass of water. Follow the directions on the prescription label. This medicine can be taken with or without food. Take your medicine at regular intervals. Do not take your medicine more often than directed. Take all of your medicine as directed even if you think you are better. Do not skip doses or stop your medicine early. Talk to your pediatrician regarding the use of this medicine in children. While this drug may be prescribed for selected conditions, precautions do apply. Overdosage: If you think you have taken too much of this medicine contact a poison control center or emergency room  at once. NOTE: This medicine is only for you. Do not share this medicine with others. What if I miss a dose? If you miss a dose, take it as soon as you can. If it is almost time for your next dose, take only that dose. Do not take double or extra doses. There should be at least 4 to 6 hours between doses. What  may interact with this medicine? -probenecid -some other antibiotics This list may not describe all possible interactions. Give your health care provider a list of all the medicines, herbs, non-prescription drugs, or dietary supplements you use. Also tell them if you smoke, drink alcohol, or use illegal drugs. Some items may interact with your medicine. What should I watch for while using this medicine? Tell your doctor or health care professional if your symptoms do not begin to improve in a few days. Do not treat diarrhea with over the counter products. Contact your doctor if you have diarrhea that lasts more than 2 days or if it is severe and watery. If you have diabetes, you may get a false-positive result for sugar in your urine. Check with your doctor or health care professional. What side effects may I notice from receiving this medicine? Side effects that you should report to your doctor or health care professional as soon as possible: -allergic reactions like skin rash, itching or hives, swelling of the face, lips, or tongue -breathing problems -pain or trouble passing urine -redness, blistering, peeling or loosening of the skin, including inside the mouth -severe or watery diarrhea -unusually weak or tired -yellowing of the eyes, skin Side effects that usually do not require medical attention (report to your doctor or health care professional if they continue or are bothersome): -gas or heartburn -genital or anal irritation -headache -joint or muscle pain -nausea, vomiting This list may not describe all possible side effects. Call your doctor for medical advice about side effects. You may report side effects to FDA at 1-800-FDA-1088. Where should I keep my medicine? Keep out of the reach of children. Store at room temperature between 59 and 86 degrees F (15 and 30 degrees C). Throw away any unused medicine after the expiration date. NOTE: This sheet is a summary. It may not cover  all possible information. If you have questions about this medicine, talk to your doctor, pharmacist, or health care provider.  2015, Elsevier/[employer] Standard. (2007-06-27 17:09:13)  Doxycycline tablets or capsules What is this medicine? DOXYCYCLINE (dox i SYE kleen) is a tetracycline antibiotic. It kills certain bacteria or stops their growth. It is used to treat many kinds of infections, like dental, skin, respiratory, and urinary tract infections. It also treats acne, Lyme disease, malaria, and certain sexually transmitted infections. This medicine may be used for other purposes; ask your health care provider or pharmacist if you have questions. COMMON BRAND NAME(S): Acticlate, Adoxa, Adoxa CK, Adoxa Pak, Adoxa TT, Alodox, Avidoxy, Doxal, Monodox, Morgidox 1x, Morgidox 1x Kit, Morgidox 2x, Morgidox 2x Kit, Ocudox, Vibra-Tabs, Vibramycin What should I tell my health care provider before I take this medicine? They need to know if you have any of these conditions: -liver disease -long exposure to sunlight like working outdoors -stomach problems like colitis -an unusual or allergic reaction to doxycycline, tetracycline antibiotics, other medicines, foods, dyes, or preservatives -pregnant or trying to get pregnant -breast-feeding How should I use this medicine? Take this medicine by mouth with a full glass of water. Follow the directions on the prescription label. It is  best to take this medicine without food, but if it upsets your stomach take it with food. Take your medicine at regular intervals. Do not take your medicine more often than directed. Take all of your medicine as directed even if you think you are better. Do not skip doses or stop your medicine early. Talk to your pediatrician regarding the use of this medicine in children. Special care may be needed. While this drug may be prescribed for children as young as 34 years old for selected conditions, precautions do apply. Overdosage: If you  think you have taken too much of this medicine contact a poison control center or emergency room at once. NOTE: This medicine is only for you. Do not share this medicine with others. What if I miss a dose? If you miss a dose, take it as soon as you can. If it is almost time for your next dose, take only that dose. Do not take double or extra doses. What may interact with this medicine? -antacids -barbiturates -birth control pills -bismuth subsalicylate -carbamazepine -methoxyflurane -other antibiotics -phenytoin -vitamins that contain iron -warfarin This list may not describe all possible interactions. Give your health care provider a list of all the medicines, herbs, non-prescription drugs, or dietary supplements you use. Also tell them if you smoke, drink alcohol, or use illegal drugs. Some items may interact with your medicine. What should I watch for while using this medicine? Tell your doctor or health care professional if your symptoms do not improve. Do not treat diarrhea with over the counter products. Contact your doctor if you have diarrhea that lasts more than 2 days or if it is severe and watery. Do not take this medicine just before going to bed. It may not dissolve properly when you lay down and can cause pain in your throat. Drink plenty of fluids while taking this medicine to also help reduce irritation in your throat. This medicine can make you more sensitive to the sun. Keep out of the sun. If you cannot avoid being in the sun, wear protective clothing and use sunscreen. Do not use sun lamps or tanning beds/booths. Birth control pills may not work properly while you are taking this medicine. Talk to your doctor about using an extra method of birth control. If you are being treated for a sexually transmitted infection, avoid sexual contact until you have finished your treatment. Your sexual partner may also need treatment. Avoid antacids, aluminum, calcium, magnesium, and iron  products for 4 hours before and 2 hours after taking a dose of this medicine. If you are using this medicine to prevent malaria, you should still protect yourself from contact with mosquitos. Stay in screened-in areas, use mosquito nets, keep your body covered, and use an insect repellent. What side effects may I notice from receiving this medicine? Side effects that you should report to your doctor or health care professional as soon as possible: -allergic reactions like skin rash, itching or hives, swelling of the face, lips, or tongue -difficulty breathing -fever -itching in the rectal or genital area -pain on swallowing -redness, blistering, peeling or loosening of the skin, including inside the mouth -severe stomach pain or cramps -unusual bleeding or bruising -unusually weak or tired -yellowing of the eyes or skin Side effects that usually do not require medical attention (report to your doctor or health care professional if they continue or are bothersome): -diarrhea -loss of appetite -nausea, vomiting This list may not describe all possible side effects. Call your  doctor for medical advice about side effects. You may report side effects to FDA at 1-800-FDA-1088. Where should I keep my medicine? Keep out of the reach of children. Store at room temperature, below 30 degrees C (86 degrees F). Protect from light. Keep container tightly closed. Throw away any unused medicine after the expiration date. Taking this medicine after the expiration date can make you seriously ill. NOTE: This sheet is a summary. It may not cover all possible information. If you have questions about this medicine, talk to your doctor, pharmacist, or health care provider.  2015, [employer]vier/Gold Standard. (2013-01-27 13:58:06)  Acetaminophen; Oxycodone tablets What is this medicine? ACETAMINOPHEN; OXYCODONE (a set a MEE noe fen; ox i KOE done) is a pain reliever. It is used to treat mild to moderate pain. This  medicine may be used for other purposes; ask your health care provider or pharmacist if you have questions. COMMON BRAND NAME(S): Endocet, Magnacet, Narvox, Percocet, Perloxx, Primalev, Primlev, Roxicet, Xolox What should I tell my health care provider before I take this medicine? They need to know if you have any of these conditions: -brain tumor -Crohn's disease, inflammatory bowel disease, or ulcerative colitis -drug abuse or addiction -head injury -heart or circulation problems -if you often drink alcohol -kidney disease or problems going to the bathroom -liver disease -lung disease, asthma, or breathing problems -an unusual or allergic reaction to acetaminophen, oxycodone, other opioid analgesics, other medicines, foods, dyes, or preservatives -pregnant or trying to get pregnant -breast-feeding How should I use this medicine? Take this medicine by mouth with a full glass of water. Follow the directions on the prescription label. Take your medicine at regular intervals. Do not take your medicine more often than directed. Talk to your pediatrician regarding the use of this medicine in children. Special care may be needed. Patients over 18 years old may have a stronger reaction and need a smaller dose. Overdosage: If you think you have taken too much of this medicine contact a poison control center or emergency room at once. NOTE: This medicine is only for you. Do not share this medicine with others. What if I miss a dose? If you miss a dose, take it as soon as you can. If it is almost time for your next dose, take only that dose. Do not take double or extra doses. What may interact with this medicine? -alcohol -antihistamines -barbiturates like amobarbital, butalbital, butabarbital, methohexital, pentobarbital, phenobarbital, thiopental, and secobarbital -benztropine -drugs for bladder problems like solifenacin, trospium, oxybutynin, tolterodine, hyoscyamine, and  methscopolamine -drugs for breathing problems like ipratropium and tiotropium -drugs for certain stomach or intestine problems like propantheline, homatropine methylbromide, glycopyrrolate, atropine, belladonna, and dicyclomine -general anesthetics like etomidate, ketamine, nitrous oxide, propofol, desflurane, enflurane, halothane, isoflurane, and sevoflurane -medicines for depression, anxiety, or psychotic disturbances -medicines for sleep -muscle relaxants -naltrexone -narcotic medicines (opiates) for pain -phenothiazines like perphenazine, thioridazine, chlorpromazine, mesoridazine, fluphenazine, prochlorperazine, promazine, and trifluoperazine -scopolamine -tramadol -trihexyphenidyl This list may not describe all possible interactions. Give your health care provider a list of all the medicines, herbs, non-prescription drugs, or dietary supplements you use. Also tell them if you smoke, drink alcohol, or use illegal drugs. Some items may interact with your medicine. What should I watch for while using this medicine? Tell your doctor or health care professional if your pain does not go away, if it gets worse, or if you have new or a different type of pain. You may develop tolerance to the medicine. Tolerance means that you  will need a higher dose of the medication for pain relief. Tolerance is normal and is expected if you take this medicine for a long time. Do not suddenly stop taking your medicine because you may develop a severe reaction. Your body becomes used to the medicine. This does NOT mean you are addicted. Addiction is a behavior related to getting and using a drug for a non-medical reason. If you have pain, you have a medical reason to take pain medicine. Your doctor will tell you how much medicine to take. If your doctor wants you to stop the medicine, the dose will be slowly lowered over time to avoid any side effects. You may get drowsy or dizzy. Do not drive, use machinery, or do  anything that needs mental alertness until you know how this medicine affects you. Do not stand or sit up quickly, especially if you are an older patient. This reduces the risk of dizzy or fainting spells. Alcohol may interfere with the effect of this medicine. Avoid alcoholic drinks. There are different types of narcotic medicines (opiates) for pain. If you take more than one type at the same time, you may have more side effects. Give your health care provider a list of all medicines you use. Your doctor will tell you how much medicine to take. Do not take more medicine than directed. Call emergency for help if you have problems breathing. The medicine will cause constipation. Try to have a bowel movement at least every 2 to 3 days. If you do not have a bowel movement for 3 days, call your doctor or health care professional. Do not take Tylenol (acetaminophen) or medicines that have acetaminophen with this medicine. Too much acetaminophen can be very dangerous. Many nonprescription medicines contain acetaminophen. Always read the labels carefully to avoid taking more acetaminophen. What side effects may I notice from receiving this medicine? Side effects that you should report to your doctor or health care professional as soon as possible: -allergic reactions like skin rash, itching or hives, swelling of the face, lips, or tongue -breathing difficulties, wheezing -confusion -light headedness or fainting spells -severe stomach pain -unusually weak or tired -yellowing of the skin or the whites of the eyes Side effects that usually do not require medical attention (report to your doctor or health care professional if they continue or are bothersome): -dizziness -drowsiness -nausea -vomiting This list may not describe all possible side effects. Call your doctor for medical advice about side effects. You may report side effects to FDA at 1-800-FDA-1088. Where should I keep my medicine? Keep out of  the reach of children. This medicine can be abused. Keep your medicine in a safe place to protect it from theft. Do not share this medicine with anyone. Selling or giving away this medicine is dangerous and against the law. Store at room temperature between 20 and 25 degrees C (68 and 77 degrees F). Keep container tightly closed. Protect from light. This medicine may cause accidental overdose and death if it is taken by other adults, children, or pets. Flush any unused medicine down the toilet to reduce the chance of harm. Do not use the medicine after the expiration date. NOTE: This sheet is a summary. It may not cover all possible information. If you have questions about this medicine, talk to your doctor, pharmacist, or health care provider.  2015, [employer]vier/[employer] Standard. (2012-11-14 13:17:35)

## 2014-02-21 NOTE — ED Provider Notes (Signed)
8:00 PM  Pt here and discharge with doxycycline and Keflex for cellulitis. Unable to afford doxycycline and is requesting Bactrim. Will give prescription for Bactrim DS 1 tablet by mouth twice daily for 10 days.  Layla MawKristen N Benicia Bergevin, DO 02/21/14 2004

## 2014-02-21 NOTE — ED Notes (Signed)
Pt leaves Ed , ambulatory, temp 99.1. No signs of distress. Pt told if he feels too drowsy to drive to please sit in lobby until he feel able to drive. Pt states he is fine and can drive home. Pt verbalizes discharge instructions. No sign of distress.

## 2014-02-26 LAB — CULTURE, BLOOD (ROUTINE X 2)
Culture: NO GROWTH
Culture: NO GROWTH

## 2014-06-24 ENCOUNTER — Encounter (HOSPITAL_COMMUNITY): Payer: Self-pay

## 2014-06-24 ENCOUNTER — Emergency Department (HOSPITAL_COMMUNITY): Payer: 59

## 2014-06-24 ENCOUNTER — Emergency Department (HOSPITAL_COMMUNITY)
Admission: EM | Admit: 2014-06-24 | Discharge: 2014-06-24 | Disposition: A | Discharge status: Home / Self Care | Payer: 59 | Attending: Emergency Medicine | Admitting: Emergency Medicine

## 2014-06-24 DIAGNOSIS — Z8614 Personal history of Methicillin resistant Staphylococcus aureus infection: Secondary | ICD-10-CM | POA: Diagnosis not present

## 2014-06-24 DIAGNOSIS — F1410 Cocaine abuse, uncomplicated: Secondary | ICD-10-CM | POA: Diagnosis present

## 2014-06-24 DIAGNOSIS — Z872 Personal history of diseases of the skin and subcutaneous tissue: Secondary | ICD-10-CM | POA: Insufficient documentation

## 2014-06-24 DIAGNOSIS — Z8739 Personal history of other diseases of the musculoskeletal system and connective tissue: Secondary | ICD-10-CM | POA: Diagnosis not present

## 2014-06-24 DIAGNOSIS — Z720 Tobacco use: Secondary | ICD-10-CM | POA: Insufficient documentation

## 2014-06-24 DIAGNOSIS — R21 Rash and other nonspecific skin eruption: Secondary | ICD-10-CM | POA: Diagnosis not present

## 2014-06-24 LAB — CBC
HCT: 49.3 % (ref 39.0–52.0)
Hemoglobin: 16.8 g/dL (ref 13.0–17.0)
MCH: 30 pg (ref 26.0–34.0)
MCHC: 34.1 g/dL (ref 30.0–36.0)
MCV: 88 fL (ref 78.0–100.0)
Platelets: 170 10*3/uL (ref 150–400)
RBC: 5.6 MIL/uL (ref 4.22–5.81)
RDW: 12.4 % (ref 11.5–15.5)
WBC: 6.1 10*3/uL (ref 4.0–10.5)

## 2014-06-24 LAB — RAPID URINE DRUG SCREEN, HOSP PERFORMED
Amphetamines: NOT DETECTED
Barbiturates: NOT DETECTED
Benzodiazepines: NOT DETECTED
Cocaine: POSITIVE — AB
Opiates: NOT DETECTED
Tetrahydrocannabinol: NOT DETECTED

## 2014-06-24 LAB — COMPREHENSIVE METABOLIC PANEL
ALT: 32 U/L (ref 0–53)
AST: 19 U/L (ref 0–37)
Albumin: 4.1 g/dL (ref 3.5–5.2)
Alkaline Phosphatase: 78 U/L (ref 39–117)
Anion gap: 8 (ref 5–15)
BUN: 18 mg/dL (ref 6–23)
CO2: 23 mmol/L (ref 19–32)
Calcium: 8.9 mg/dL (ref 8.4–10.5)
Chloride: 106 mmol/L (ref 96–112)
Creatinine, Ser: 1.07 mg/dL (ref 0.50–1.35)
GFR calc Af Amer: >90 mL/min (ref >90)
GFR calc non Af Amer: 86 mL/min — ABNORMAL LOW (ref >90)
Glucose, Bld: 112 mg/dL — ABNORMAL HIGH (ref 70–99)
Potassium: 3.9 mmol/L (ref 3.5–5.1)
Sodium: 137 mmol/L (ref 135–145)
Total Bilirubin: 0.6 mg/dL (ref 0.3–1.2)
Total Protein: 6.9 g/dL (ref 6.0–8.3)

## 2014-06-24 LAB — ETHANOL: Alcohol, Ethyl (B): <5 mg/dL (ref 0–9)

## 2014-06-24 LAB — ACETAMINOPHEN LEVEL: Acetaminophen (Tylenol), Serum: <10 ug/mL — ABNORMAL LOW (ref 10–30)

## 2014-06-24 LAB — SALICYLATE LEVEL: Salicylate Lvl: <4 mg/dL (ref 2.8–20.0)

## 2014-06-24 NOTE — ED Notes (Signed)
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 Patient had Dr. Christell ConstantMoore, a family physician and friend bring him to the ER.

## 2014-06-24 NOTE — ED Notes (Signed)
Discharge instructions given, pt demonstrated teach back and verbal understanding.Phone numbers and addresses given for outpatient rehab and counseling facilities. No concerns voiced.

## 2014-06-24 NOTE — ED Notes (Signed)
Last used Cocaine last night.

## 2014-06-24 NOTE — ED Provider Notes (Signed)
CSN: [id number]     Arrival date & time 06/24/14  1908 History   First MD Initiated Contact with Patient 06/24/14 1932     Chief Complaint  Patient presents with  . Drug Problem     (Consider location/radiation/quality/duration/timing/severity/associated sxs/prior Treatment) HPI Comments: Patient requesting cocaine detox. Has been using cocaine for many years. Last use was yesterday. He was referred by his PCP and told to come to the ER for medical clearance. He snorts cocaine denies any injection drug use. Endorses occasional alcohol use. Denies any suicidal or homicidal thoughts. No hallucinations. Endorses some bruising to his right elbow after bumping it several nights ago. No fevers, chills, nausea or vomiting. No chest pain or shortness of breath.  The history is provided by the patient.    Past Medical History  Diagnosis Date  . MRSA infection   . Cellulitis of left leg 2008  . Back pain    History reviewed. No pertinent past surgical history. No family history on file. History  Substance Use Topics  . Smoking status: Current Some Day Smoker -- 0.25 packs/day for 15 years    Types: Cigarettes  . Smokeless tobacco: Current User     Comment: 0.25ppw - refused nicotine  . Alcohol Use: Yes     Comment: Occ    Review of Systems  Constitutional: Negative for fever, activity change and appetite change.  Respiratory: Negative for cough, chest tightness and shortness of breath.   Cardiovascular: Negative for chest pain.  Gastrointestinal: Negative for nausea, vomiting and abdominal distention.  Genitourinary: Negative for dysuria and hematuria.  Musculoskeletal: Negative for myalgias and arthralgias.  Skin: Positive for rash.  Psychiatric/Behavioral: Negative for suicidal ideas and self-injury.  A complete 10 system review of systems was obtained and all systems are negative except as noted in the HPI and PMH.      Allergies  Tramadol  Home Medications   Prior to  Admission medications   Medication Sig Start Date End Date Taking? Authorizing Provider  amphetamine-dextroamphetamine (ADDERALL) 20 MG tablet Take 1 tablet (20 mg total) by mouth daily. Patient not taking: Reported on 06/24/2014 11/15/13   Deatra Canter, FNP  cephALEXin (KEFLEX) 500 MG capsule Take 1 capsule (500 mg total) by mouth 4 (four) times daily. Patient not taking: Reported on 06/24/2014 02/21/14   Dione Booze, MD  doxycycline (VIBRAMYCIN) 100 MG capsule Take 1 capsule (100 mg total) by mouth 2 (two) times daily. Patient not taking: Reported on 06/24/2014 02/21/14   Dione Booze, MD  lisdexamfetamine (VYVANSE) 50 MG capsule Take 1 capsule (50 mg total) by mouth daily. Patient not taking: Reported on 06/24/2014 11/14/13   Deatra Canter, FNP  lisdexamfetamine (VYVANSE) 50 MG capsule Take 1 capsule (50 mg total) by mouth daily. Patient not taking: Reported on 06/24/2014 11/14/13   Deatra Canter, FNP  oxyCODONE-acetaminophen (PERCOCET) 5-325 MG per tablet Take 1 tablet by mouth every 4 (four) hours as needed for moderate pain. Patient not taking: Reported on 06/24/2014 02/21/14   Dione Booze, MD   BP 154/85 mmHg  Pulse 68  Temp(Src) 98.6 F (37 C) (Oral)  Resp 20  Ht 6' (1.829 m)  Wt 217 lb 14.4 oz (98.839 kg)  BMI 29.55 kg/m2  SpO2 99% Physical Exam  Constitutional: He is oriented to person, place, and time. He appears well-developed and well-nourished. No distress.  HENT:  Head: Normocephalic and atraumatic.  Mouth/Throat: Oropharynx is clear and moist. No oropharyngeal exudate.  Eyes: Conjunctivae and  EOM are normal. Pupils are equal, round, and reactive to light.  Neck: Normal range of motion. Neck supple.  No meningismus.  Cardiovascular: Normal rate, regular rhythm, normal heart sounds and intact distal pulses.   No murmur heard. Pulmonary/Chest: Effort normal and breath sounds normal. No respiratory distress.  Abdominal: Soft. There is no tenderness. There is no rebound  and no guarding.  Musculoskeletal: Normal range of motion. He exhibits no edema or tenderness.  eechymosis T R elbow, FROM. No erythema, intact distal pulse  Neurological: He is alert and oriented to person, place, and time. No cranial nerve deficit. He exhibits normal muscle tone. Coordination normal.  No ataxia on finger to nose bilaterally. No pronator drift. 5/5 strength throughout. CN 2-12 intact. Negative Romberg. Equal grip strength. Sensation intact. Gait is normal.   Skin: Skin is warm.  Psychiatric: He has a normal mood and affect. His behavior is normal.  Nursing note and vitals reviewed.   ED Course  Procedures (including critical care time) Labs Review Labs Reviewed  URINE RAPID DRUG SCREEN (HOSP PERFORMED) - Abnormal; Notable for the following:    Cocaine POSITIVE (*)    All other components within normal limits  ACETAMINOPHEN LEVEL - Abnormal; Notable for the following:    Acetaminophen (Tylenol), Serum <10.0 (*)    All other components within normal limits  COMPREHENSIVE METABOLIC PANEL - Abnormal; Notable for the following:    Glucose, Bld 112 (*)    GFR calc non Af Amer 86 (*)    All other components within normal limits  CBC  ETHANOL  SALICYLATE LEVEL  URINE RAPID DRUG SCREEN (HOSP PERFORMED)    Imaging Review Dg Elbow Complete Right  06/24/2014   CLINICAL DATA:  39 year old male right elbow pain for 1 day. No known injury. Initial encounter.  EXAM: RIGHT ELBOW - COMPLETE 3+ VIEW  COMPARISON:  None.  FINDINGS: There is no evidence of fracture, subluxation or dislocation.  There is no evidence of elbow effusion.  No radiographic evidence of osteomyelitis noted.  Mild posterior soft tissue swelling noted.  IMPRESSION: No bony abnormalities or elbow effusion.  Mild posterior soft tissue swelling/ olecranon bursitis.   Electronically Signed   By: Harmon PierJeffrey  Hu M.D.   On: 06/24/2014 20:44     EKG Interpretation None      MDM   Final diagnoses:  Cocaine abuse   cocaine abuse.  No SI or HI.  No injection drug use.  X-ray obtained of bruised area of right elbow. No fracture seen. Drug screen positive for cocaine. Patient denies any injection drug use. No suicidal or homicidal thoughts.  He is stable for outpatient follow-up with detox facility of his choice. Resource guide Given.  Glynn OctaveStephen Fenris Cauble, MD 06/24/14 [id number]

## 2014-06-24 NOTE — Discharge Instructions (Signed)
Chemical Dependency Choose a detox facility from the list below and let them know you are medically cleared. Return to the ED if you develop new or worsening symptoms. Chemical dependency is an addiction to drugs or alcohol. It is characterized by the repeated behavior of seeking out and using drugs and alcohol despite harmful consequences to the health and safety of ones self and others.  RISK FACTORS There are certain situations or behaviors that increase a person's risk for chemical dependency. These include:  A family history of chemical dependency.  A history of mental health issues, including depression and anxiety.  A home environment where drugs and alcohol are easily available to you.  Drug or alcohol use at a young age. SYMPTOMS  The following symptoms can indicate chemical dependency:  Inability to limit the use of drugs or alcohol.  Nausea, sweating, shakiness, and anxiety that occurs when alcohol or drugs are not being used.  An increase in amount of drugs or alcohol that is necessary to get drunk or high. People who experience these symptoms can assess their use of drugs and alcohol by asking themselves the following questions:  Have you been told by friends or family that they are worried about your use of alcohol or drugs?  Do friends and family ever tell you about things you did while drinking alcohol or using drugs that you do not remember?  Do you lie about using alcohol or drugs or about the amounts you use?  Do you have difficulty completing daily tasks unless you use alcohol or drugs?  Is the level of your work or school performance lower because of your drug or alcohol use?  Do you get sick from using drugs or alcohol but keep using anyway?  Do you feel uncomfortable in social situations unless you use alcohol or drugs?  Do you use drugs or alcohol to help forget problems? An answer of yes to any of these questions may indicate chemical dependency.  Professional evaluation is suggested. Document Released: 03/17/2001 Document Revised: 06/15/2011 Document Reviewed: 05/29/2010 [facility] Patient Information 2015 [place], [place]. This information is not intended to replace advice given to you by your health care provider. Make sure you discuss any questions you have with your health care provider.    Emergency Department Resource Guide 1) Find a Doctor and Pay Out of Pocket Although you won't have to find out who is covered by your insurance plan, it is a good idea to ask around and get recommendations. You will then need to call the office and see if the doctor you have chosen will accept you as a new patient and what types of options they offer for patients who are self-pay. Some doctors offer discounts or will set up payment plans for their patients who do not have insurance, but you will need to ask so you aren't surprised when you get to your appointment.  2) Contact Your Local Health Department Not all health departments have doctors that can see patients for sick visits, but many do, so it is worth a call to see if yours does. If you don't know where your local health department is, you can check in your phone book. The CDC also has a tool to help you locate your state's health department, and many state websites also have listings of all of their local health departments.  3) Find a Walk-in Clinic If your illness is not likely to be very severe or complicated, you may want to try a  walk in clinic. These are popping up all over the country in pharmacies, drugstores, and shopping centers. They're usually staffed by nurse practitioners or physician assistants that have been trained to treat common illnesses and complaints. They're usually fairly quick and inexpensive. However, if you have serious medical issues or chronic medical problems, these are probably not your best option.  No Primary Care Doctor: - Call Health Connect at  [phone] - they  can help you locate a primary care doctor that  accepts your insurance, provides certain services, etc. - Physician Referral Service- [phone]  Chronic Pain Problems: Organization         Address  Phone   Notes  [facility]  [phone] Patients need to be referred by their primary care doctor.   Medication Assistance: Organization         Address  Phone   Notes  [facility] Medication [facility] [address] [place], [place] [address] [phone] --Must be a resident of [facility] -- Must have NO insurance coverage whatsoever (no Medicaid/ Medicare, etc.) -- The pt. MUST have a primary care doctor that directs their care regularly and follows them in the community   [facility]  [phone]   [employer]  [phone]    Agencies that provide inexpensive medical care: Organization         Address  Phone   Notes  [facility]  [phone]   [facility]    [phone]   [facility] [address] [place], [place] [address] [phone]   [facility] [address] [place], [place] [phone]   [facility]anned Parenthood    [phone]   [facility]    [phone]   [facility]  [address], [place] Phone:  [phone], Fax:  [phone] Hours of Operation:  9 am - 6 pm, M-F.  Also accepts Medicaid/Medicare and self-pay.  [facility]  [address], [place] Phone: [phone], Fax: [phone]. Hours of Operation:  8:30 am - 5:30 pm, M-F.  Also accepts Medicaid and self-pay.  [facility] [place] [address] [place] Phone: [phone]   [facility] [address] Natasha Bence [place], [place] [phone], Ext. [phone] Mondays & Thursdays: 7-9 AM.  First 15 patients are seen on a first come, first serve basis.    Medicaid-accepting  [facility] Providers:  Organization         Address  Phone   Notes  [facility] [address], [place] [phone] Also accepts self-pay patients.  [facility] [address] Laurell Josephs [place], [place]  [phone]   [facility] [address], [place] [phone]   [facility] [address] [place] [phone]   Renaye Rakers [address], [place]   [phone] Only accepts [place] patients after they have their name applied to their card.   Self-Pay (no insurance) in [facility]:  Organization         Address  Phone   Notes  Sickle Cell Patients, [facility] Internal Medicine [address] [place], [place] [phone]   [facility] [address] [place], [place] [phone]   Patrcia Dolly  [facility]  [address], [place] [phone]   [facility]/Dr. Osei-Bonsu  [address] [place] [address], [place] or [address], [place] [phone] Phone number for both [place] and [place] locations is the same.  [facility] [address], [place] [phone](336) [phone]   [facility] [place] [address] [place] [address], [place] or [address] [phone]) [phone]   [facility] [address], [place] [phone]) [phone], phone; [phone]) [phone], fax Sees patients 1st and 3rd Saturday of every month.  Must not qualify for public or private insurance (i.e. Medicaid, Medicare, [place] Health Choice, Veterans' Benefits)  Household income should be no more than 200% of the poverty level The clinic cannot treat you if you are pregnant or think you are pregnant  Sexually transmitted diseases are not treated at the clinic.    Dental Care: Organization         Address  Phone  Notes  [facility]  [facility] [address], [place] [phone]) [phone] Accepts children up to age 39 who are enrolled in [place] or [place] Health Choice; pregnant women with a Medicaid card; and children who have applied for Medicaid or [place] Health Choice, but were declined, whose parents can pay a reduced fee at time of service.  [facility] [place]  [address], [place] [phone] Accepts children up to age 39 who are enrolled in [place] or [place] Health Choice; pregnant women with a Medicaid card; and children who have applied for Medicaid or [place] Health Choice, but were declined, whose parents can pay a reduced fee at time of service.  [facility]  [address], [place] [phone]) [phone] Patients are seen by appointment only. Walk-ins are not accepted. [facility] will see patients 39 years of age and older. Monday - Tuesday (8am-5pm) Most Wednesdays (8:30-5pm) $30 per visit, cash only  [facility]  [address], [facility] [place] [phone]) [phone] Patients are seen by appointment only. Walk-ins are not accepted. [facility] will see patients 39 years of age and older. One Wednesday Evening (Monthly: Volunteer Based).  $30 per visit, cash only  [facility]  [phone] for adults; Children under age 244, call Graduate Pediatric Dentistry at [phone]) [phone]. Children aged 144-14, please call [phone] to request a pediatric application.  Dental services are provided in all areas of dental care including fillings, crowns and bridges, complete and partial dentures, implants, gum treatment, root canals, and extractions. Preventive care is also provided. Treatment is provided to both adults and children. Patients are selected via a lottery and there is often a waiting list.   [facility] [address], [place]  [phone]) [phone] [email or web address]   [facility] [address], [place], [place] [phone], Ext. [phone] Second and Fourth Thursday of each month, opens at 6:30 AM; Clinic ends at 9 AM.  Patients are seen on a first-come first-served basis, and a limited number are seen during each clinic.   [facility]  [address], [place], [place] [phone]   Eligibility Requirements You must have lived in [place], [place], or [place] counties for at least the last three months.   You cannot be eligible for state or federal sponsored healthcare  insurance, including [employer], [place], or [employer].   You generally cannot be eligible for healthcare insurance through your employer.    How to apply: Eligibility screenings are held every Tuesday and Wednesday afternoon from 1:00 pm until 4:00 pm. You do not need an appointment for the interview!  [facility] [address] [place], [place] [phone]   [facility] Health Department  [phone]   [facility] Health Department  [phone]   [facility] Health Department  [phone]    Behavioral Health Resources in the Community: Intensive Outpatient Programs Organization         Address  Phone  Notes  [facility] [address], [place], [place] [phone]   [facility] [address] [place], [place] [phone]   [facility] [address], [place], [place]  [phone]   [facility] [address]  [place], [place] [phone] or [phone]   Substance Abuse Resources Organization         Address  Phone  Notes  Al[facility] Drug Services  [phone]   [facility]  [phone]   [facility] [place]  [phone]   Floydene Flock  [phone]   Residential & Outpatient Substance Abuse Program  [phone]   Psychological Services Organization         Address  Phone  Notes  [facility]   [phone]   [facility]  [phone]   [facility] Mental Health [address], [place] [phone] or [phone]    Mobile Crisis Teams Organization         Address  Phone  Notes  [facility], Mobile Crisis Care Unit  [phone]   [facility]  [address] [place], [place] [phone]   Doristine Locks [address] [place] [phone]    Self-Help/Support Groups Organization         Address  Phone             Notes  [facility] [place] - variety of support groups  [phone]- I7437963 Call for more information  Narcotics Anonymous (NA), [facility] [address], [employer] [place]  2 meetings at this location   Statistician         Address  Phone  Notes  [facility] [address],    [place]  [phone]   [facility]  [address], [place] [address], [place], [place] [phone]   [facility] [address] [place], [place] [phone] Admissions: 8am-3pm M-F  [facility] [address]    [place], [place] [phone]   [facility] [address] [place], [place], [place] [phone]   [facility] [address]  [place], [place] [phone]   [facility] - Intensive Outpatient [address]ure[address], [place], [place] [phone]   [facility] [address] [place]  [place], [place] [phone] or [phone]   [facility] ([facility]) [address], [place], [place] [phone] Accepts Medicaid  [facility] [address]  [place] [phone] Substance Abuse/Addiction Treatment   [facility] Organization         Address  Phone  Notes  [facility]  [phone]   Angie Fava, PhD [address] [place], [place]   [phone] or  (  [place]) [phone]   [facility]   [address] [place], [place] [phone]   [address], [place], [place] [phone] Insurance/Medicaid/sponsorship through [facility] and Families [address] [place], [place] [phone] [place] [address]   [place], [place] [phone]    Dr. Adele Schilder  [phone]   [facility] Dept. 1) [address], [place] 2) [place] 3)  [address], [place] [phone]  [phone]   [place] [phone] or [phone] (After Hours)

## 2016-06-28 ENCOUNTER — Observation Stay (HOSPITAL_COMMUNITY)
Admission: EM | Admit: 2016-06-28 | Discharge: 2016-06-29 | Disposition: A | Discharge status: Home / Self Care | Payer: Self-pay | Attending: Family Medicine | Admitting: Family Medicine

## 2016-06-28 ENCOUNTER — Emergency Department (HOSPITAL_COMMUNITY): Payer: Self-pay

## 2016-06-28 ENCOUNTER — Observation Stay (HOSPITAL_COMMUNITY): Payer: Self-pay

## 2016-06-28 ENCOUNTER — Encounter (HOSPITAL_COMMUNITY): Payer: Self-pay | Admitting: Emergency Medicine

## 2016-06-28 DIAGNOSIS — M25469 Effusion, unspecified knee: Secondary | ICD-10-CM

## 2016-06-28 DIAGNOSIS — L0390 Cellulitis, unspecified: Secondary | ICD-10-CM | POA: Diagnosis present

## 2016-06-28 DIAGNOSIS — L0291 Cutaneous abscess, unspecified: Secondary | ICD-10-CM

## 2016-06-28 DIAGNOSIS — L03115 Cellulitis of right lower limb: Secondary | ICD-10-CM | POA: Insufficient documentation

## 2016-06-28 DIAGNOSIS — L02414 Cutaneous abscess of left upper limb: Principal | ICD-10-CM | POA: Insufficient documentation

## 2016-06-28 DIAGNOSIS — F17210 Nicotine dependence, cigarettes, uncomplicated: Secondary | ICD-10-CM | POA: Insufficient documentation

## 2016-06-28 DIAGNOSIS — Z5181 Encounter for therapeutic drug level monitoring: Secondary | ICD-10-CM | POA: Insufficient documentation

## 2016-06-28 LAB — BASIC METABOLIC PANEL
Anion gap: 8 (ref 5–15)
BUN: 12 mg/dL (ref 6–20)
CO2: 24 mmol/L (ref 22–32)
Calcium: 8.7 mg/dL — ABNORMAL LOW (ref 8.9–10.3)
Chloride: 106 mmol/L (ref 101–111)
Creatinine, Ser: 0.86 mg/dL (ref 0.61–1.24)
GFR calc Af Amer: >60 mL/min (ref >60)
GFR calc non Af Amer: >60 mL/min (ref >60)
Glucose, Bld: 147 mg/dL — ABNORMAL HIGH (ref 65–99)
Potassium: 3.2 mmol/L — ABNORMAL LOW (ref 3.5–5.1)
Sodium: 138 mmol/L (ref 135–145)

## 2016-06-28 LAB — RAPID URINE DRUG SCREEN, HOSP PERFORMED
Amphetamines: NOT DETECTED
Barbiturates: NOT DETECTED
Benzodiazepines: NOT DETECTED
Cocaine: NOT DETECTED
Opiates: NOT DETECTED
Tetrahydrocannabinol: NOT DETECTED

## 2016-06-28 LAB — CBC WITH DIFFERENTIAL/PLATELET
Basophils Absolute: 0 10*3/uL (ref 0.0–0.1)
Basophils Relative: 0 %
Eosinophils Absolute: 0.2 10*3/uL (ref 0.0–0.7)
Eosinophils Relative: 2 %
HCT: 45.1 % (ref 39.0–52.0)
Hemoglobin: 15.9 g/dL (ref 13.0–17.0)
Lymphocytes Relative: 18 %
Lymphs Abs: 1.7 10*3/uL (ref 0.7–4.0)
MCH: 30.4 pg (ref 26.0–34.0)
MCHC: 35.3 g/dL (ref 30.0–36.0)
MCV: 86.2 fL (ref 78.0–100.0)
Monocytes Absolute: 0.9 10*3/uL (ref 0.1–1.0)
Monocytes Relative: 10 %
Neutro Abs: 6.5 10*3/uL (ref 1.7–7.7)
Neutrophils Relative %: 70 %
Platelets: 156 10*3/uL (ref 150–400)
RBC: 5.23 MIL/uL (ref 4.22–5.81)
RDW: 12.1 % (ref 11.5–15.5)
WBC: 9.3 10*3/uL (ref 4.0–10.5)

## 2016-06-28 MED ORDER — KETOROLAC TROMETHAMINE 30 MG/ML IJ SOLN
15.0000 mg | Freq: Once | INTRAMUSCULAR | Status: AC
  Administered 2016-06-28: 15 mg via INTRAVENOUS
  Filled 2016-06-28: qty 1

## 2016-06-28 MED ORDER — ONDANSETRON HCL 4 MG/2ML IJ SOLN
4.0000 mg | Freq: Once | INTRAMUSCULAR | Status: AC
  Administered 2016-06-28: 4 mg via INTRAVENOUS
  Filled 2016-06-28: qty 2

## 2016-06-28 MED ORDER — VANCOMYCIN HCL IN DEXTROSE 1-5 GM/200ML-% IV SOLN
1000.0000 mg | Freq: Once | INTRAVENOUS | Status: AC
  Administered 2016-06-28: 1000 mg via INTRAVENOUS
  Filled 2016-06-28: qty 200

## 2016-06-28 MED ORDER — ENOXAPARIN SODIUM 40 MG/0.4ML ~~LOC~~ SOLN
40.0000 mg | SUBCUTANEOUS | Status: DC
  Filled 2016-06-28 (×2): qty 0.4

## 2016-06-28 MED ORDER — TETANUS-DIPHTH-ACELL PERTUSSIS 5-2.5-18.5 LF-MCG/0.5 IM SUSP
0.5000 mL | Freq: Once | INTRAMUSCULAR | Status: DC
  Filled 2016-06-28: qty 0.5

## 2016-06-28 MED ORDER — LIDOCAINE HCL (PF) 1 % IJ SOLN
5.0000 mL | Freq: Once | INTRAMUSCULAR | Status: AC
Start: 2016-06-28 — End: 2016-06-28
  Administered 2016-06-28: 5 mL
  Filled 2016-06-28: qty 5

## 2016-06-28 MED ORDER — POVIDONE-IODINE 10 % EX SOLN
CUTANEOUS | Status: AC
Start: 2016-06-28 — End: 2016-06-28
  Administered 2016-06-28: 10:00:00
  Filled 2016-06-28: qty 118

## 2016-06-28 MED ORDER — SULFAMETHOXAZOLE-TRIMETHOPRIM 800-160 MG PO TABS
2.0000 | ORAL_TABLET | Freq: Two times a day (BID) | ORAL | Status: DC
  Administered 2016-06-28 – 2016-06-29 (×2): 2 via ORAL
  Filled 2016-06-28 (×3): qty 2

## 2016-06-28 MED ORDER — ACETAMINOPHEN 325 MG PO TABS
650.0000 mg | ORAL_TABLET | Freq: Four times a day (QID) | ORAL | Status: DC | PRN
  Administered 2016-06-28 – 2016-06-29 (×2): 650 mg via ORAL
  Filled 2016-06-28 (×2): qty 2

## 2016-06-28 MED ORDER — ACETAMINOPHEN 650 MG RE SUPP: 650.0000 mg | Freq: Four times a day (QID) | RECTAL | Status: DC | PRN

## 2016-06-28 MED ORDER — IBUPROFEN 600 MG PO TABS
600.0000 mg | ORAL_TABLET | Freq: Four times a day (QID) | ORAL | Status: DC | PRN
  Administered 2016-06-29: 600 mg via ORAL
  Filled 2016-06-28: qty 1

## 2016-06-28 NOTE — ED Triage Notes (Signed)
Patient c/o abscess behind left ear, in left axillary, and right knee. Per patient first abscess appeared 2 days ago. Denies any drainage. Patient reports heat, swelling, and redness to right knee. Per patient nausea but no vomiting. Unsure of any fevers but reports chills.

## 2016-06-28 NOTE — ED Notes (Signed)
Per Hospitalist stop vancomycin at this time. Dr Clayborne DanaMesner and Mayer CamelH Neese aware

## 2016-06-28 NOTE — ED Provider Notes (Signed)
Medical screening examination/treatment/procedure(s) were conducted as a shared visit with non-physician practitioner(s) and myself.  I personally evaluated the patient during the encounter.  History of multiple abscesses/cellulitis here with the same. Initially had one on right posterior scalp area but that resolved. No has left axilla, left posterior scalp abscesses and right knee cellulitis. On my exam, no e/o sepsis. Able to range the right knee without any obvious itnernal joint pain, doubt septic arthritis but would be more comfortable with overnight observation after IV antibiotics to ensure symptoms improved.     Marily MemosJason Encarnacion Bole, MD 06/28/16 [phone]

## 2016-06-28 NOTE — ED Notes (Signed)
Report to Tamika, RN 

## 2016-06-28 NOTE — ED Provider Notes (Signed)
[facility]-EM[facility]Y DEPT Provider Note   CSN: [id number] Arrival date & time: 06/28/16  0755     History   Chief Complaint Chief Complaint  Patient presents with  . Abscess    HPI Robert Ferguson is a 41 y.o. male who presents to the ED with an abscess to the left ear, left axilla and the right knee that started 3 days ago and has gotten worse. Patient reports that the right knee is red, swollen with heat and tenderness. Patient states he has a hx of MRSA and hx of abscesses in the past that have required treatment with IV antibiotics.   The history is provided by the patient. No language interpreter was used.  Abscess  Location:  Shoulder/arm Shoulder/arm abscess location:  L axilla Size:  2 cm Abscess quality: fluctuance, painful and warmth   Red streaking: no   Duration:  3 days Progression:  Worsening Pain details:    Quality:  Throbbing   Severity:  Moderate   Timing:  Constant   Progression:  Worsening Chronicity:  New Relieved by:  Nothing Associated symptoms: nausea   Associated symptoms: no fever, no headaches and no vomiting   Risk factors: hx of MRSA and prior abscess     Past Medical History:  Diagnosis Date  . Back pain   . Cellulitis of left leg 2008  . MRSA infection     Patient Active Problem List   Diagnosis Date Noted  . Cellulitis 06/28/2016  . Cellulitis of right lower extremity 09/24/2012  . Thrombocytopenia (HCC) 09/24/2012    History reviewed. No pertinent surgical history.     Home Medications    Prior to Admission medications   Medication Sig Start Date End Date Taking? Authorizing Provider  acetaminophen (TYLENOL) 500 MG tablet Take 1,500 mg by mouth every 6 (six) hours as needed.   Yes Historical Provider, MD    Family History No family history on file.  Social History Social History  Substance Use Topics  . Smoking status: Current Some Day Smoker    Packs/day: 0.25    Years: 15.00    Types: Cigarettes  . Smokeless  tobacco: Never Used     Comment: 0.25ppw - refused nicotine  . Alcohol use Yes     Comment: Occ     Allergies   Tramadol   Review of Systems Review of Systems  Constitutional: Positive for chills. Negative for fever.  Respiratory: Negative for cough and shortness of breath.   Cardiovascular: Negative for chest pain.  Gastrointestinal: Positive for nausea. Negative for abdominal pain and vomiting.  Musculoskeletal: Negative for neck stiffness.  Skin: Positive for wound.  Neurological: Negative for headaches.  Hematological: Positive for adenopathy.  Psychiatric/Behavioral: Negative for confusion.     Physical Exam Updated Vital Signs BP (!) 145/76   Pulse 91   Temp 97.4 F (36.3 C) (Oral)   Resp 18   Ht 6' (1.829 m)   Wt 95.3 kg   SpO2 99%   BMI 28.48 kg/m   Physical Exam  Constitutional: He is oriented to person, place, and time. He appears well-developed and well-nourished. No distress.  HENT:  Head: Normocephalic.  Eyes: EOM are normal.  Neck: Neck supple.  Cardiovascular: Normal rate.   Pulmonary/Chest: Effort normal.  Abdominal: He exhibits no distension.  Musculoskeletal:       Right shoulder: He exhibits no deformity. Decreased range of motion: due to pain and swelling.       Right knee: He exhibits  swelling and erythema. He exhibits no deformity and normal alignment. Decreased range of motion: due to pain and swelling. Tenderness found.  Right knee with erythema surrounding small abscess. Increased warmth, tenderness.  Left axilla with tender, swollen area with erythema c/w abscess.  Left posterior ear with erythema and tenderness.    Neurological: He is alert and oriented to person, place, and time. No cranial nerve deficit.  Skin:  Abscess to right knee, left axilla and behind left ear. Increased warmth and erythema right  knee.   Psychiatric: He has a normal mood and affect. His behavior is normal.  Nursing note and vitals reviewed.    ED  Treatments / Results  Labs (all labs ordered are listed, but only abnormal results are displayed) Labs Reviewed  BASIC METABOLIC PANEL - Abnormal; Notable for the following:       Result Value   Potassium 3.2 (*)    Glucose, Bld 147 (*)    Calcium 8.7 (*)    All other components within normal limits  CULTURE, BLOOD (ROUTINE X 2)  CULTURE, BLOOD (ROUTINE X 2)  CBC WITH DIFFERENTIAL/PLATELET  RAPID URINE DRUG SCREEN, HOSP PERFORMED     Radiology Dg Knee Complete 4 Views Right  Result Date: 06/28/2016 CLINICAL DATA:  Soft tissue swelling and cellulitis EXAM: RIGHT KNEE - COMPLETE 4+ VIEW COMPARISON:  None. FINDINGS: Frontal, lateral, and bilateral oblique views were obtained. There is marked prepatellar soft tissue edema. There is a small joint effusion. No fracture or dislocation. No joint space narrowing. No erosive change or bony destruction. IMPRESSION: Extensive prepatellar soft tissue swelling. No air seen in this prepatellar soft tissue edema. There is a small joint effusion. No bony destruction or erosion. No joint space narrowing. No fracture or dislocation. Electronically Signed   By: Bretta Bang III M.D.   On: 06/28/2016 09:32    Procedures .Marland KitchenIncision and Drainage Date/Time: 06/28/2016 9:52 AM Performed by: Janne Napoleon Authorized by: Janne Napoleon   Consent:    Consent obtained:  Verbal   Consent given by:  Patient   Risks discussed:  Incomplete drainage   Alternatives discussed:  Alternative treatment Location:    Type:  Abscess   Size:  2 cm   Location:  Upper extremity   Upper extremity location:  Arm   Arm location:  L upper arm (axilla) Pre-procedure details:    Skin preparation:  Betadine Anesthesia (see MAR for exact dosages):    Anesthesia method:  Local infiltration   Local anesthetic:  Lidocaine 1% w/o epi Procedure type:    Complexity:  Complex Procedure details:    Needle aspiration: no     Incision types:  Single straight   Incision depth:   Dermal   Scalpel blade:  11   Wound management:  Probed and deloculated   Drainage:  Bloody and purulent   Drainage amount:  Scant   Packing materials:  1/4 in iodoform gauze Post-procedure details:    Patient tolerance of procedure:  Tolerated well, no immediate complications Comments:     Up to date on tetanus   (including critical care time)  Medications Ordered in ED Medications  ketorolac (TORADOL) 30 MG/ML injection 15 mg (15 mg Intravenous Given 06/28/16 0845)  ondansetron (ZOFRAN) injection 4 mg (4 mg Intravenous Given 06/28/16 0845)  lidocaine (PF) (XYLOCAINE) 1 % injection 5 mL (5 mLs Infiltration Given by Other 06/28/16 0944)  vancomycin (VANCOCIN) IVPB 1000 mg/200 mL premix (0 mg Intravenous Stopped 06/28/16 1026)  povidone-iodine (BETADINE) 10 % external solution (  Given by Other 06/28/16 1009)     Initial Impression / Assessment and Plan / ED Course  I have reviewed the triage vital signs and the nursing notes.  Pertinent labs & imaging results that were available during my care of the patient were reviewed by me and considered in my medical decision making (see chart for details).  Dr. Clayborne Dana spoke with the admitting physician and patient will be admitted.  Discussed with the patient plan of care and he agrees.  Final Clinical Impressions(s) / ED Diagnoses   Final diagnoses:  Abscess  Cellulitis of right knee    New Prescriptions New Prescriptions   No medications on file     [facility], NP 06/28/16 1033    Marily Memos, MD 06/28/16 1558

## 2016-06-28 NOTE — H&P (Signed)
History and Physical    Robert HeraldJames M Vierling ZOX:[id number] DOB: 01/05/1976 DOA: 06/28/2016  PCP: Pcp Not In System  Patient coming from: Home  Chief Complaint: Abscesses  HPI: Robert Ferguson is a 41 y.o. male with medical history significant of recurrent abscesses presents to the ED for evaluation of abscesses.  Patient reports that 7+ days ago developed a small bump on posterior head which he kept picking at.  6 days ago he has hot grease exposed to the right upper extremity at his job.  Two days later developed abscesses in the left axilla.  He reports he has recurrent flare ups of abscesses that ultimately require IV antibiotics.  Has a history of MRSA.  Reports subjective fevers/chills, but states that his right knee has been increasingly swollen.  Voices that the swelling worsened last night after working a 12 hour shift.  Still able to bend right knee.  Elevated knee and reports that the swelling improved.  Went to the ED for evaluation.  ED Course: In ED was found to have numerous small abscesses and concern for knee erythema and tenderness.  Axillary abscess incised and drained and packing place.  [facility] asked to admit for observation and possibly IV antibiotics.  Review of Systems: As per HPI otherwise 10 point review of systems negative.    Past Medical History:  Diagnosis Date  . Back pain   . Cellulitis of left leg 2008  . MRSA infection     History reviewed. No pertinent surgical history.   reports that he has been smoking Cigarettes.  He has a 3.75 pack-year smoking history. He has never used smokeless tobacco. He reports that he drinks alcohol. He reports that he does not use drugs.  Allergies  Allergen Reactions  . Tramadol Nausea Only    No family history on file.  Prior to Admission medications   Medication Sig Start Date End Date Taking? Authorizing Provider  acetaminophen (TYLENOL) 500 MG tablet Take 1,500 mg by mouth every 6 (six) hours as needed.   Yes Historical  Provider, MD    Physical Exam: Vitals:   06/28/16 0806 06/28/16 0900 06/28/16 0930  BP: (!) 160/97 128/65 (!) 145/76  Pulse: 91    Resp: 18    Temp: 97.4 F (36.3 C)    TempSrc: Oral    SpO2: 99%    Weight: 95.3 kg (210 lb)    Height: 6' (1.829 m)        Constitutional: NAD, calm, comfortable Vitals:   06/28/16 0806 06/28/16 0900 06/28/16 0930  BP: (!) 160/97 128/65 (!) 145/76  Pulse: 91    Resp: 18    Temp: 97.4 F (36.3 C)    TempSrc: Oral    SpO2: 99%    Weight: 95.3 kg (210 lb)    Height: 6' (1.829 m)     Eyes: PERRL, lids and conjunctivae normal ENMT: Mucous membranes are moist. Posterior pharynx clear of any exudate or lesions.Normal dentition.  Neck: normal, supple, no masses, no thyromegaly Respiratory: clear to auscultation bilaterally, no wheezing, no crackles. Normal respiratory effort. No accessory muscle use.  Cardiovascular: Regular rate and rhythm, no murmurs / rubs / gallops. No extremity edema. 2+ pedal pulses. No carotid bruits.  Abdomen: no tenderness, no masses palpated. No hepatosplenomegaly. Bowel sounds positive.  Musculoskeletal: no clubbing / cyanosis. No joint deformity upper and lower extremities. Good ROM, no contractures. Normal muscle tone.  Right knee with tenderness to palpation on patellar surface but able to flex  and extend independently.  Some minor swelling of the prepatellar region without exquisite tenderness appreciated Skin: fluctuance post auricularly on the left, packing in place into incision of the left axilla at sight of I&D, right knee with erythema on patellar surface and small <1cm lesion on the patella with no appreciable discharge or drainage Neurologic: CN 2-12 grossly intact. Sensation intact, DTR normal. Strength 5/5 in all 4.  Psychiatric: Normal judgment and insight. Alert and oriented x 3. Normal mood.    Labs on Admission: I have personally reviewed following labs and imaging studies  CBC:  Recent Labs Lab  06/28/16 0810  WBC 9.3  NEUTROABS 6.5  HGB 15.9  HCT 45.1  MCV 86.2  PLT 156   Basic Metabolic Panel:  Recent Labs Lab 06/28/16 0810  NA 138  K 3.2*  CL 106  CO2 24  GLUCOSE 147*  BUN 12  CREATININE 0.86  CALCIUM 8.7*   GFR: Estimated Creatinine Clearance: 135.4 mL/min (by C-G formula based on SCr of 0.86 mg/dL). Liver Function Tests: No results for input(s): AST, ALT, ALKPHOS, BILITOT, PROT, ALBUMIN in the last 168 hours. No results for input(s): LIPASE, AMYLASE in the last 168 hours. No results for input(s): AMMONIA in the last 168 hours. Coagulation Profile: No results for input(s): INR, PROTIME in the last 168 hours. Cardiac Enzymes: No results for input(s): CKTOTAL, CKMB, CKMBINDEX, TROPONINI in the last 168 hours. BNP (last 3 results) No results for input(s): PROBNP in the last 8760 hours. HbA1C: No results for input(s): HGBA1C in the last 72 hours. CBG: No results for input(s): GLUCAP in the last 168 hours. Lipid Profile: No results for input(s): CHOL, HDL, LDLCALC, TRIG, CHOLHDL, LDLDIRECT in the last 72 hours. Thyroid Function Tests: No results for input(s): TSH, T4TOTAL, FREET4, T3FREE, THYROIDAB in the last 72 hours. Anemia Panel: No results for input(s): VITAMINB12, FOLATE, FERRITIN, TIBC, IRON, RETICCTPCT in the last 72 hours. Urine analysis:    Component Value Date/Time   COLORURINE AMBER (A) 02/21/2014 0202   APPEARANCEUR CLEAR 02/21/2014 0202   LABSPEC 1.025 02/21/2014 0202   PHURINE 5.5 02/21/2014 0202   GLUCOSEU NEGATIVE 02/21/2014 0202   HGBUR NEGATIVE 02/21/2014 0202   BILIRUBINUR SMALL (A) 02/21/2014 0202   KETONESUR NEGATIVE 02/21/2014 0202   PROTEINUR NEGATIVE 02/21/2014 0202   UROBILINOGEN 0.2 02/21/2014 0202   NITRITE NEGATIVE 02/21/2014 0202   LEUKOCYTESUR NEGATIVE 02/21/2014 0202   Sepsis Labs: !!!!!!!!!!!!!!!!!!!!!!!!!!!!!!!!!!!!!!!!!!!! @LABRCNTIP (procalcitonin:4,lacticidven:4) ) Recent Results (from the past 240 hour(s))    Blood culture (routine x 2)     Status: None (Preliminary result)   Collection Time: 06/28/16  9:32 AM  Result Value Ref Range Status   Specimen Description BLOOD LEFT ANTECUBITAL  Final   Special Requests BOTTLES DRAWN AEROBIC AND ANAEROBIC 10CC  Final   Culture PENDING  Incomplete   Report Status PENDING  Incomplete  Blood culture (routine x 2)     Status: None (Preliminary result)   Collection Time: 06/28/16  9:38 AM  Result Value Ref Range Status   Specimen Description BLOOD BLOOD LEFT HAND  Final   Special Requests BOTTLES DRAWN AEROBIC AND ANAEROBIC 8CC  Final   Culture PENDING  Incomplete   Report Status PENDING  Incomplete     Radiological Exams on Admission: Dg Knee Complete 4 Views Right  Result Date: 06/28/2016 CLINICAL DATA:  Soft tissue swelling and cellulitis EXAM: RIGHT KNEE - COMPLETE 4+ VIEW COMPARISON:  None. FINDINGS: Frontal, lateral, and bilateral oblique views were obtained. There is  marked prepatellar soft tissue edema. There is a small joint effusion. No fracture or dislocation. No joint space narrowing. No erosive change or bony destruction. IMPRESSION: Extensive prepatellar soft tissue swelling. No air seen in this prepatellar soft tissue edema. There is a small joint effusion. No bony destruction or erosion. No joint space narrowing. No fracture or dislocation. Electronically Signed   By: Bretta Bang III M.D.   On: 06/28/2016 09:32     Assessment/Plan Principal Problem:   Cellulitis    Cellulitis - no signs of systemic infection - axilla was I&D'ed in ED - 1 dose of vancomycin given in ED - h/o MRSA - will give Bactrim PO  - u/s of right knee ordered to evaluate for abscess or joint effusion - HIV and drug screen ordered - pending results of U/S may get general surgery involved   DVT prophylaxis: Lovenox  Code Status: Full code  Family Communication: no family bedside  Disposition Plan: likely discharge back to previous home environment  tomorrow Consults called: none  Admission status: obs- med surg    Katrinka Blazing MD [facility] Pager [phone]  If 7PM-7AM, please contact night-coverage [email or web address] Password [facility]  06/28/2016, 10:46 AM

## 2016-06-29 ENCOUNTER — Observation Stay (HOSPITAL_COMMUNITY): Payer: Self-pay

## 2016-06-29 ENCOUNTER — Other Ambulatory Visit (HOSPITAL_COMMUNITY): Payer: 59

## 2016-06-29 DIAGNOSIS — L03115 Cellulitis of right lower limb: Secondary | ICD-10-CM

## 2016-06-29 LAB — CBC WITH DIFFERENTIAL/PLATELET
Basophils Absolute: 0 10*3/uL (ref 0.0–0.1)
Basophils Relative: 0 %
Eosinophils Absolute: 0.1 10*3/uL (ref 0.0–0.7)
Eosinophils Relative: 1 %
HCT: 42.8 % (ref 39.0–52.0)
Hemoglobin: 14.9 g/dL (ref 13.0–17.0)
Lymphocytes Relative: 14 %
Lymphs Abs: 1.4 10*3/uL (ref 0.7–4.0)
MCH: 30.1 pg (ref 26.0–34.0)
MCHC: 34.8 g/dL (ref 30.0–36.0)
MCV: 86.5 fL (ref 78.0–100.0)
Monocytes Absolute: 1.2 10*3/uL — ABNORMAL HIGH (ref 0.1–1.0)
Monocytes Relative: 13 %
Neutro Abs: 7 10*3/uL (ref 1.7–7.7)
Neutrophils Relative %: 72 %
Platelets: 142 10*3/uL — ABNORMAL LOW (ref 150–400)
RBC: 4.95 MIL/uL (ref 4.22–5.81)
RDW: 12.2 % (ref 11.5–15.5)
WBC: 9.7 10*3/uL (ref 4.0–10.5)

## 2016-06-29 LAB — HIV ANTIBODY (ROUTINE TESTING W REFLEX): HIV Screen 4th Generation wRfx: NONREACTIVE

## 2016-06-29 LAB — HEMOGLOBIN A1C
Hgb A1c MFr Bld: 5.2 % (ref 4.8–5.6)
Mean Plasma Glucose: 103 mg/dL

## 2016-06-29 MED ORDER — LIDOCAINE HCL (PF) 1 % IJ SOLN
5.0000 mL | Freq: Once | INTRAMUSCULAR | Status: AC
  Administered 2016-06-29: 5 mL via INTRADERMAL
  Filled 2016-06-29: qty 5

## 2016-06-29 MED ORDER — SULFAMETHOXAZOLE-TRIMETHOPRIM 800-160 MG PO TABS: 2.0000 | ORAL_TABLET | Freq: Two times a day (BID) | ORAL | 0 refills | Status: AC

## 2016-06-29 MED ORDER — KETOROLAC TROMETHAMINE 30 MG/ML IJ SOLN
30.0000 mg | Freq: Four times a day (QID) | INTRAMUSCULAR | Status: DC | PRN
  Administered 2016-06-29: 30 mg via INTRAVENOUS
  Filled 2016-06-29: qty 1

## 2016-06-29 NOTE — Discharge Instructions (Signed)
Cellulitis, Adult Cellulitis is a skin infection. The infected area is usually red and sore. This condition occurs most often in the arms and lower legs. It is very important to get treated for this condition. Follow these instructions at home:  Take over-the-counter and prescription medicines only as told by your doctor.  If you were prescribed an antibiotic medicine, take it as told by your doctor. Do not stop taking the antibiotic even if you start to feel better.  Drink enough fluid to keep your pee (urine) clear or pale yellow.  Do not touch or rub the infected area.  Raise (elevate) the infected area above the level of your heart while you are sitting or lying down.  Place warm or cold wet cloths (warm or cold compresses) on the infected area. Do this as told by your doctor.  Keep all follow-up visits as told by your doctor. This is important. These visits let your doctor make sure your infection is not getting worse. Contact a doctor if:  You have a fever.  Your symptoms do not get better after 1-2 days of treatment.  Your bone or joint under the infected area starts to hurt after the skin has healed.  Your infection comes back. This can happen in the same area or another area.  You have a swollen bump in the infected area.  You have new symptoms.  You feel ill and also have muscle aches and pains. Get help right away if:  Your symptoms get worse.  You feel very sleepy.  You throw up (vomit) or have watery poop (diarrhea) for a long time.  There are red streaks coming from the infected area.  Your red area gets larger.  Your red area turns darker. This information is not intended to replace advice given to you by your health care provider. Make sure you discuss any questions you have with your health care provider. Document Released: 09/09/2007 Document Revised: 08/29/2015 Document Reviewed: 01/30/2015 Elsevier Interactive Patient Education  2017 [employer]  

## 2016-06-29 NOTE — Discharge Summary (Signed)
Physician Discharge Summary  Robert Ferguson:[id number] DOB: [birth year] DOA: 06/28/2016  PCP: Pcp Not In System  Admit date: 06/28/2016 Discharge date: 06/29/2016  Admitted From: Home Disposition:  Home  Recommendations for Outpatient Follow-up:  1. Please return to the hospital or go to urgent care with any questions regarding wounds 2. Please do daily wound care 3. Take antibiotic as prescribed 4. Get set up with PCP  Home Health: No Equipment/Devices: None    Discharge Condition: Stable  CODE STATUS:Full Diet recommendation: Regular diet  Brief/Interim Summary: Robert Ferguson is a 41 y.o. male with medical history significant of recurrent abscesses presents to the ED for evaluation of abscesses.  Patient reports that 7+ days ago developed a small bump on posterior head which he kept picking at.  6 days ago he has hot grease exposed to the right upper extremity at his job.  Two days later developed abscesses in the left axilla.  He reports he has recurrent flare ups of abscesses that ultimately require IV antibiotics.  Has a history of MRSA.  Reports subjective fevers/chills, but states that his right knee has been increasingly swollen.  Voices that the swelling worsened last night after working a 12 hour shift.  Still able to bend right knee.  Elevated knee and reports that the swelling improved.  Went to the ED for evaluation.  Patient was able to tolerate PO and was placed on bactrim.  Ultrasound of the knee showing edema but no effusion or abscess.  Patient had full rang of motion of right knee throughout hospitalization.  On day of discharge the questionable abscess behind left ear had popped.  He was sent home on a course of Bactrim and wound care instructions.  He was told to return with any worsening of the wounds, fevers/ chills, decreased range of motion of right knee.  He voiced understanding of all of these.  Discharge Diagnoses:  Principal Problem:   Cellulitis    Discharge  Instructions  Discharge Instructions    Call MD for:  difficulty breathing, headache or visual disturbances    Complete by:  As directed    Call MD for:  extreme fatigue    Complete by:  As directed    Call MD for:  hives    Complete by:  As directed    Call MD for:  persistant dizziness or light-headedness    Complete by:  As directed    Call MD for:  persistant nausea and vomiting    Complete by:  As directed    Call MD for:  severe uncontrolled pain    Complete by:  As directed    Call MD for:  temperature >100.4    Complete by:  As directed    Diet - low sodium heart healthy    Complete by:  As directed    Discharge instructions    Complete by:  As directed    Wound care daily- please remove packing 1 inch each day- if falls out please keep wound open with Q tip until it heals inside naturally   Increase activity slowly    Complete by:  As directed    Other Restrictions    Complete by:  As directed    Work restrictions- please do not handle food until antibiotic course is completed     Allergies as of 06/29/2016      Reactions   Tramadol Nausea Only      Medication List    TAKE  these medications   acetaminophen 500 MG tablet Commonly known as:  TYLENOL Take 1,500 mg by mouth every 6 (six) hours as needed.   sulfamethoxazole-trimethoprim 800-160 MG tablet Commonly known as:  BACTRIM DS,SEPTRA DS Take 2 tablets by mouth every 12 (twelve) hours.       Allergies  Allergen Reactions  . Tramadol Nausea Only    Consultations:  Wound care   Procedures/Studies: Dg Knee Complete 4 Views Right  Result Date: 06/28/2016 CLINICAL DATA:  Soft tissue swelling and cellulitis EXAM: RIGHT KNEE - COMPLETE 4+ VIEW COMPARISON:  None. FINDINGS: Frontal, lateral, and bilateral oblique views were obtained. There is marked prepatellar soft tissue edema. There is a small joint effusion. No fracture or dislocation. No joint space narrowing. No erosive change or bony destruction.  IMPRESSION: Extensive prepatellar soft tissue swelling. No air seen in this prepatellar soft tissue edema. There is a small joint effusion. No bony destruction or erosion. No joint space narrowing. No fracture or dislocation. Electronically Signed   By: Bretta BangWilliam  Woodruff III M.D.   On: 06/28/2016 09:32   [place] Rt Lower Extrem Ltd Soft Tissue Non Vascular  Result Date: 06/29/2016 CLINICAL DATA:  Tenderness and swelling in right patellar area. Evaluate for abscess. EXAM: ULTRASOUND RIGHT LOWER EXTREMITY LIMITED TECHNIQUE: Ultrasound examination of the lower extremity soft tissues was performed in the area of clinical concern. COMPARISON:  Plain films of 1 day prior FINDINGS: Multiplanar grayscale ultrasound about the right prepatellar region demonstrates soft tissue edema, without focal fluid collection. IMPRESSION: Prepatellar edema, without fluid collection. Electronically Signed   By: Jeronimo GreavesKyle  Talbot M.D.   On: 06/29/2016 08:31      Subjective: Patient reports he feels well.  Has been able to ambulate to and from bathroom without problem.  Stated abscess behind his ear opened and is draining.  Discharge Exam: Vitals:   06/29/16 0444 06/29/16 1413  BP: 137/85 (!) 144/74  Pulse: 88 69  Resp: 15 16  Temp: 99.3 F (37.4 C) 97.7 F (36.5 C)   Vitals:   06/28/16 1127 06/28/16 2013 06/29/16 0444 06/29/16 1413  BP: 128/62 128/79 137/85 (!) 144/74  Pulse: 72 93 88 69  Resp: 18 16 15 16   Temp: 97.3 F (36.3 C) 98.9 F (37.2 C) 99.3 F (37.4 C) 97.7 F (36.5 C)  TempSrc:  Oral Oral Oral  SpO2: 98% 99% 98% 96%  Weight: 95.3 kg (210 lb)     Height: 6' (1.829 m)       General: Pt is alert, awake, not in acute distress Cardiovascular: RRR, S1/S2 +, no rubs, no gallops Respiratory: CTA bilaterally, no wheezing, no rhonchi Abdominal: Soft, NT, ND, bowel sounds + Extremities: no edema, no cyanosis Skin: erythema of the left axilla stable from yesterday but induration improved, scab over the  opening of the lesion behind the left ear and knee erythema is localized on the right knee    The results of significant diagnostics from this hospitalization (including imaging, microbiology, ancillary and laboratory) are listed below for reference.     Microbiology: Recent Results (from the past 240 hour(s))  Blood culture (routine x 2)     Status: None (Preliminary result)   Collection Time: 06/28/16  9:32 AM  Result Value Ref Range Status   Specimen Description BLOOD LEFT ANTECUBITAL  Final   Special Requests BOTTLES DRAWN AEROBIC AND ANAEROBIC 10CC  Final   Culture NO GROWTH < 24 HOURS  Final   Report Status PENDING  Incomplete  Blood  culture (routine x 2)     Status: None (Preliminary result)   Collection Time: 06/28/16  9:38 AM  Result Value Ref Range Status   Specimen Description BLOOD BLOOD LEFT HAND  Final   Special Requests BOTTLES DRAWN AEROBIC AND ANAEROBIC 8CC  Final   Culture NO GROWTH < 24 HOURS  Final   Report Status PENDING  Incomplete     Labs: BNP (last 3 results) No results for input(s): BNP in the last 8760 hours. Basic Metabolic Panel:  Recent Labs Lab 06/28/16 0810  NA 138  K 3.2*  CL 106  CO2 24  GLUCOSE 147*  BUN 12  CREATININE 0.86  CALCIUM 8.7*   Liver Function Tests: No results for input(s): AST, ALT, ALKPHOS, BILITOT, PROT, ALBUMIN in the last 168 hours. No results for input(s): LIPASE, AMYLASE in the last 168 hours. No results for input(s): AMMONIA in the last 168 hours. CBC:  Recent Labs Lab 06/28/16 0810 06/29/16 1115  WBC 9.3 9.7  NEUTROABS 6.5 7.0  HGB 15.9 14.9  HCT 45.1 42.8  MCV 86.2 86.5  PLT 156 142*   Cardiac Enzymes: No results for input(s): CKTOTAL, CKMB, CKMBINDEX, TROPONINI in the last 168 hours. BNP: Invalid input(s): POCBNP CBG: No results for input(s): GLUCAP in the last 168 hours. D-Dimer No results for input(s): DDIMER in the last 72 hours. Hgb A1c  Recent Labs  06/28/16 0810  HGBA1C 5.2    Lipid Profile No results for input(s): CHOL, HDL, LDLCALC, TRIG, CHOLHDL, LDLDIRECT in the last 72 hours. Thyroid function studies No results for input(s): TSH, T4TOTAL, T3FREE, THYROIDAB in the last 72 hours.  Invalid input(s): FREET3 Anemia work up No results for input(s): VITAMINB12, FOLATE, FERRITIN, TIBC, IRON, RETICCTPCT in the last 72 hours. Urinalysis    Component Value Date/Time   COLORURINE AMBER (A) 02/21/2014 0202   APPEARANCEUR CLEAR 02/21/2014 0202   LABSPEC 1.025 02/21/2014 0202   PHURINE 5.5 02/21/2014 0202   GLUCOSEU NEGATIVE 02/21/2014 0202   HGBUR NEGATIVE 02/21/2014 0202   BILIRUBINUR SMALL (A) 02/21/2014 0202   KETONESUR NEGATIVE 02/21/2014 0202   PROTEINUR NEGATIVE 02/21/2014 0202   UROBILINOGEN 0.2 02/21/2014 0202   NITRITE NEGATIVE 02/21/2014 0202   LEUKOCYTESUR NEGATIVE 02/21/2014 0202   Sepsis Labs Invalid input(s): PROCALCITONIN,  WBC,  LACTICIDVEN Microbiology Recent Results (from the past 240 hour(s))  Blood culture (routine x 2)     Status: None (Preliminary result)   Collection Time: 06/28/16  9:32 AM  Result Value Ref Range Status   Specimen Description BLOOD LEFT ANTECUBITAL  Final   Special Requests BOTTLES DRAWN AEROBIC AND ANAEROBIC 10CC  Final   Culture NO GROWTH < 24 HOURS  Final   Report Status PENDING  Incomplete  Blood culture (routine x 2)     Status: None (Preliminary result)   Collection Time: 06/28/16  9:38 AM  Result Value Ref Range Status   Specimen Description BLOOD BLOOD LEFT HAND  Final   Special Requests BOTTLES DRAWN AEROBIC AND ANAEROBIC 8CC  Final   Culture NO GROWTH < 24 HOURS  Final   Report Status PENDING  Incomplete     Time coordinating discharge: Over 30 minutes  SIGNED:   Katrinka Blazing, MD  [facility] 06/29/2016, 2:26 PM Pager [phone] If 7PM-7AM, please contact night-coverage [email or web address] Password [email or web address]

## 2016-06-29 NOTE — Care Management Note (Signed)
Case Management Note  Patient Details  Name: Robert Ferguson MRN: [id number] Date of Birth: 01/09/1976     Expected Discharge Date:  06/29/16               Expected Discharge Plan:     In-House Referral:     Discharge planning Services  CM Consult  Post Acute Care Choice:    Choice offered to:  NA  DME Arranged:    DME Agency:     HH Arranged:    HH Agency:     Status of Service:  Completed, signed off  If discussed at [employer] Length of Stay Meetings, dates discussed:    Additional Comments: Patient discharging home today. CM gave list of providers accepting patient's. Patient reports he should have insurance in a few weeks. No other CM needs. [facility]charg[facility]   Miracle Criado, Chrystine OilerSharley Diane, RN 06/29/2016, 3:35 PM

## 2016-06-29 NOTE — Consult Note (Signed)
WOC Nurse wound consult note Reason for Consult: Abscess to left axilla.  Has history of MRSA.  Right knee increasingly swollen now.  Swollen lesion behind left ear.   Ultrasound of knee revealed edema.  No fluid.  No surgical consult noted.   Left axilla was I & D in ED.  Will continue orders for Iodoform packing daily.  Is currently on Bactrim for antimicrobial coverage.  Wound type:infectious Pressure Injury POA:  N/A Measurement:2 cm raised lesion was lanced in ED.  Right knee is intact and edematous.   Wound RUE:[id number]:packing to left axilla.  Drainage (amount, consistency, odor) Moderate serosanguinous drainage.  Periwound:erythema and tenderness.  Dressing procedure/placement/frequency:Cleanse left axilla with NS and pat gently dry.  Apply Iodoform packing strip to wound bed.  Cover with 4x4 gauze and kerlix.tape.  Change daily.  Will not follow at this time.  Please re-consult if needed.  Robert HudsonKaren Celisa Schoenberg RN BSN [facility]ON Pager [phone]

## 2016-07-03 LAB — CULTURE, BLOOD (ROUTINE X 2)
Culture: NO GROWTH
Culture: NO GROWTH

## 2017-08-25 ENCOUNTER — Ambulatory Visit: Payer: 59 | Admitting: Family Medicine

## 2017-09-01 IMAGING — US US EXTREM LOW*R* LIMITED
1 series · 14 of 17 positions shown · non-contrast
Comparison: Plain films of 1 day prior

CLINICAL DATA: Tenderness and swelling in right patellar area.
Evaluate for abscess.

EXAM:
ULTRASOUND RIGHT LOWER EXTREMITY LIMITED
TECHNIQUE: Ultrasound examination of the lower extremity soft tissues was
performed in the area of clinical concern.

[Series 1: us extrem low*right* limited · 0.05mm/px · 17 acquisitions, 14 frames shown]
[im 1/17]
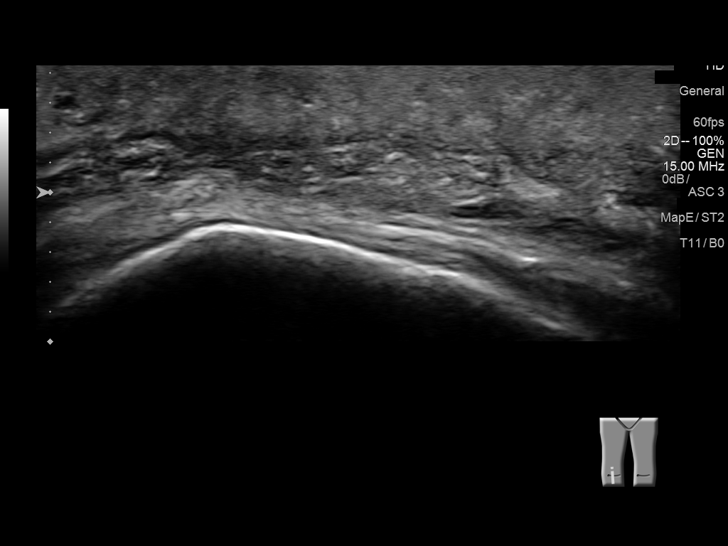
[im 2/17]
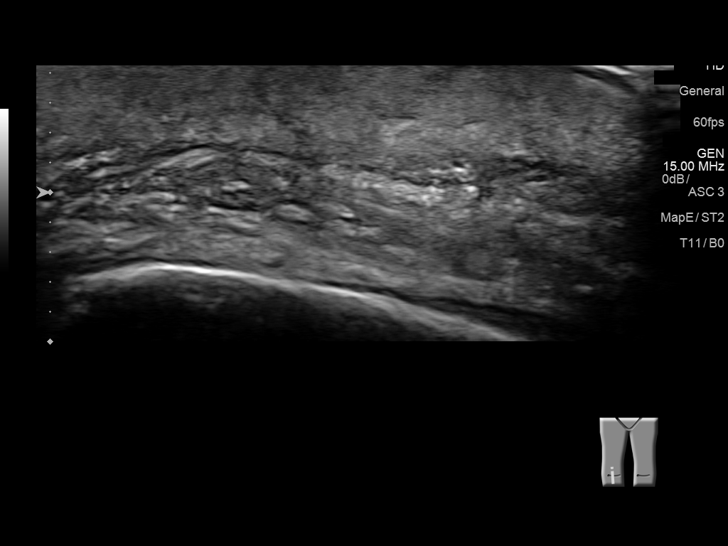
[im 4/17]
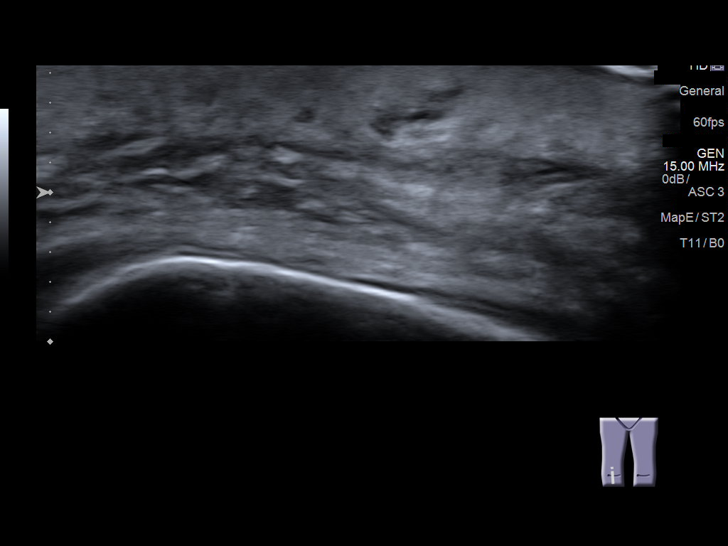
[im 5/17]
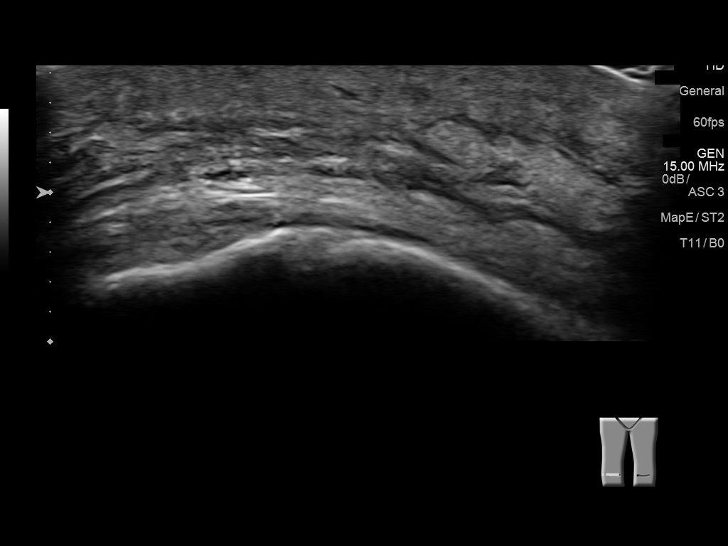
[im 6/17]
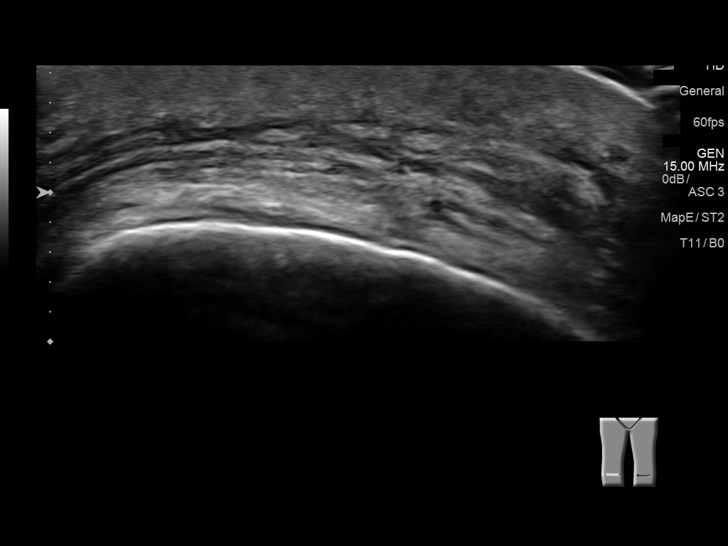
[im 7/17]
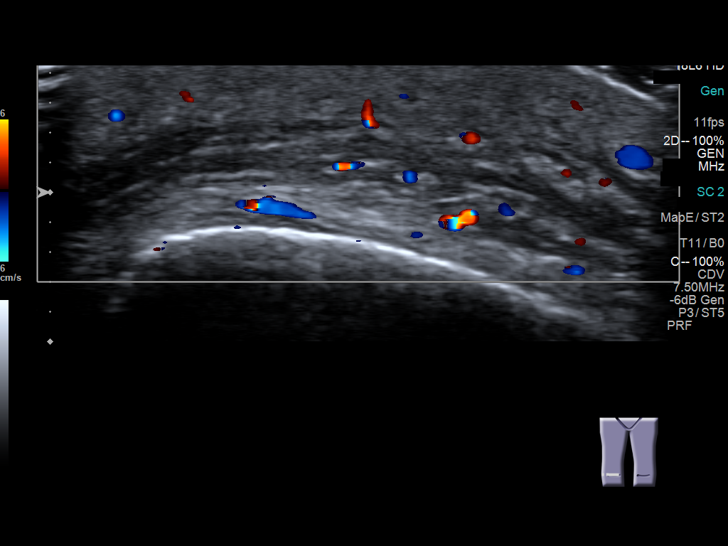
[im 8/17]
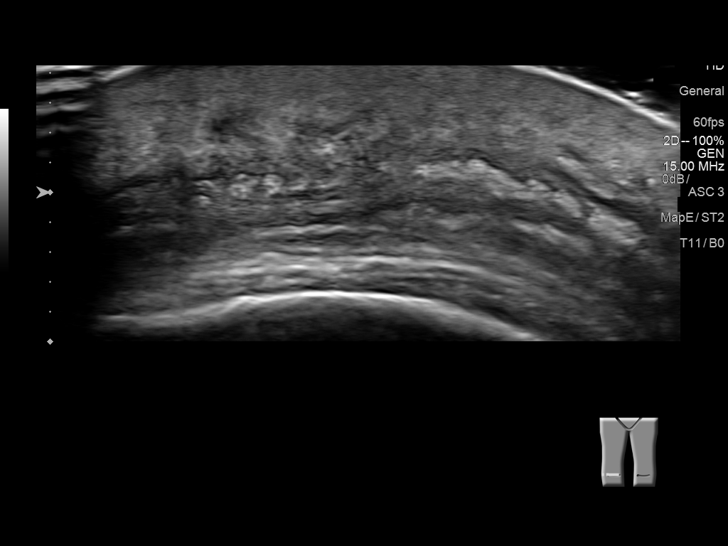
[im 10/17]
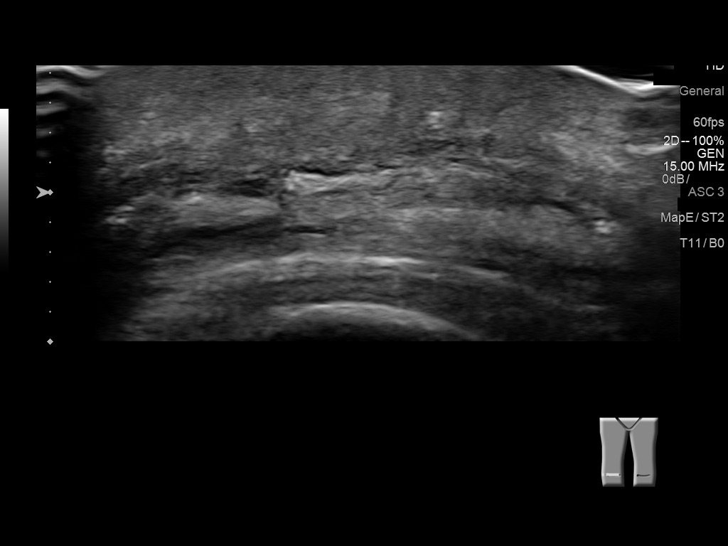
[im 11/17]
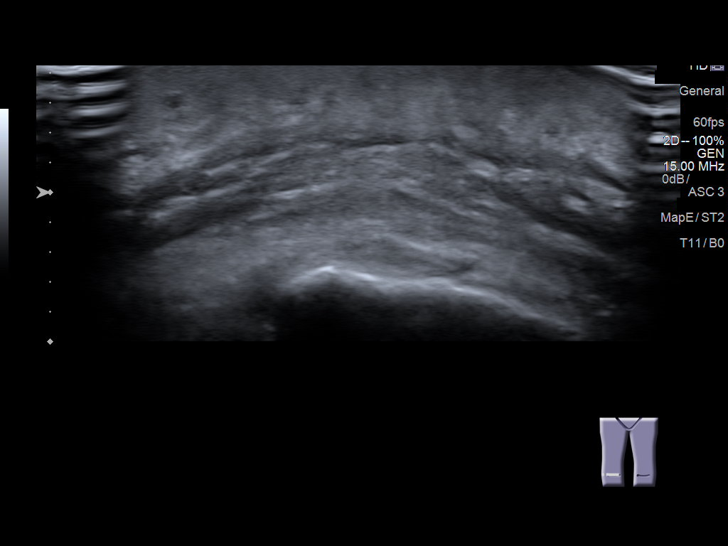
[im 12/17]
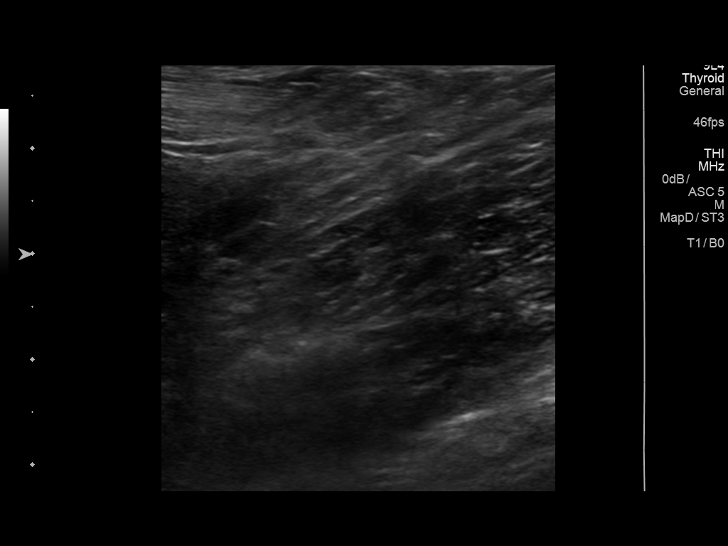
[im 13/17]
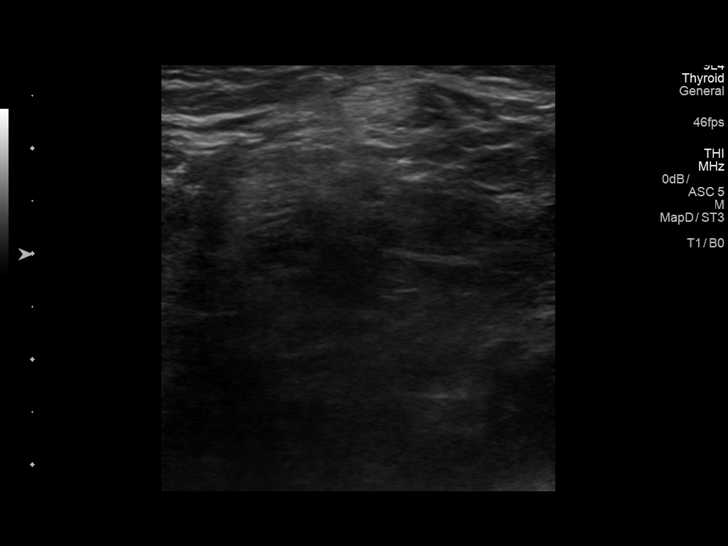
[im 14/17]
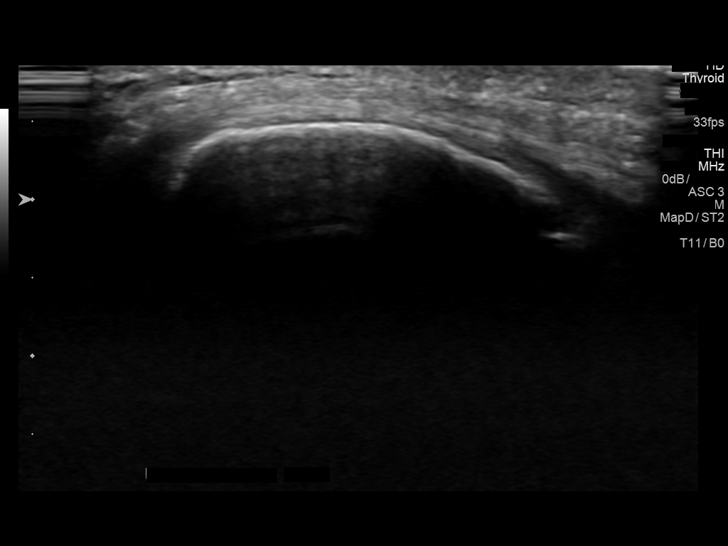
[im 16/17]
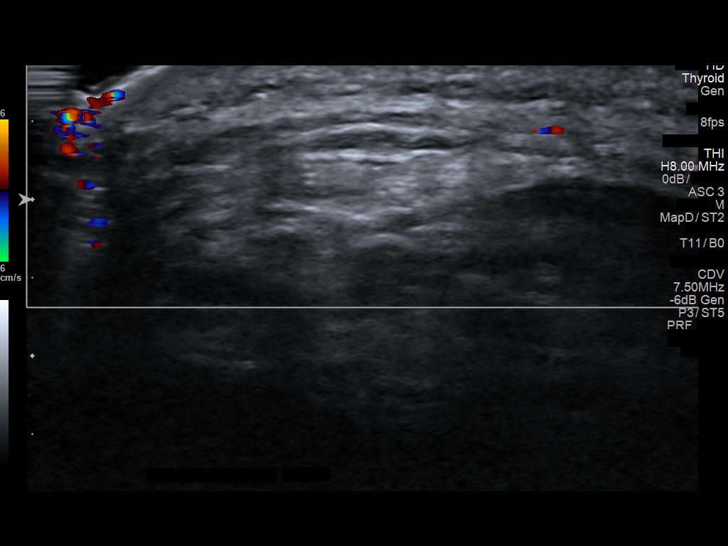
[im 17/17]
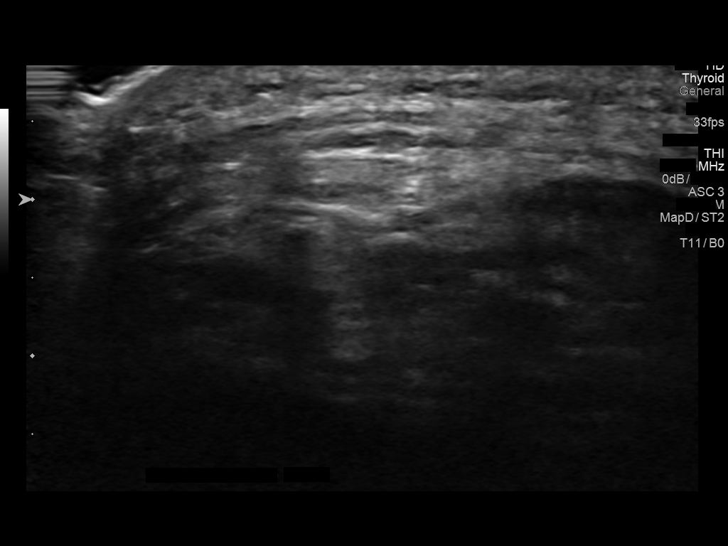

[14 of 17 positions shown; findings below may reference images not displayed]

FINDINGS: Multiplanar grayscale ultrasound about the right prepatellar region
demonstrates soft tissue edema, without focal fluid collection.
IMPRESSION: Prepatellar edema, without fluid collection.

## 2017-09-03 ENCOUNTER — Ambulatory Visit: Payer: Self-pay | Admitting: Family Medicine

## 2018-03-31 IMAGING — DX DG KNEE COMPLETE 4+V*R*
4 series · 4 of 4 positions shown · non-contrast
Comparison: None.

CLINICAL DATA: Soft tissue swelling and cellulitis

EXAM:
RIGHT KNEE - COMPLETE 4+ VIEW

[knee ap]
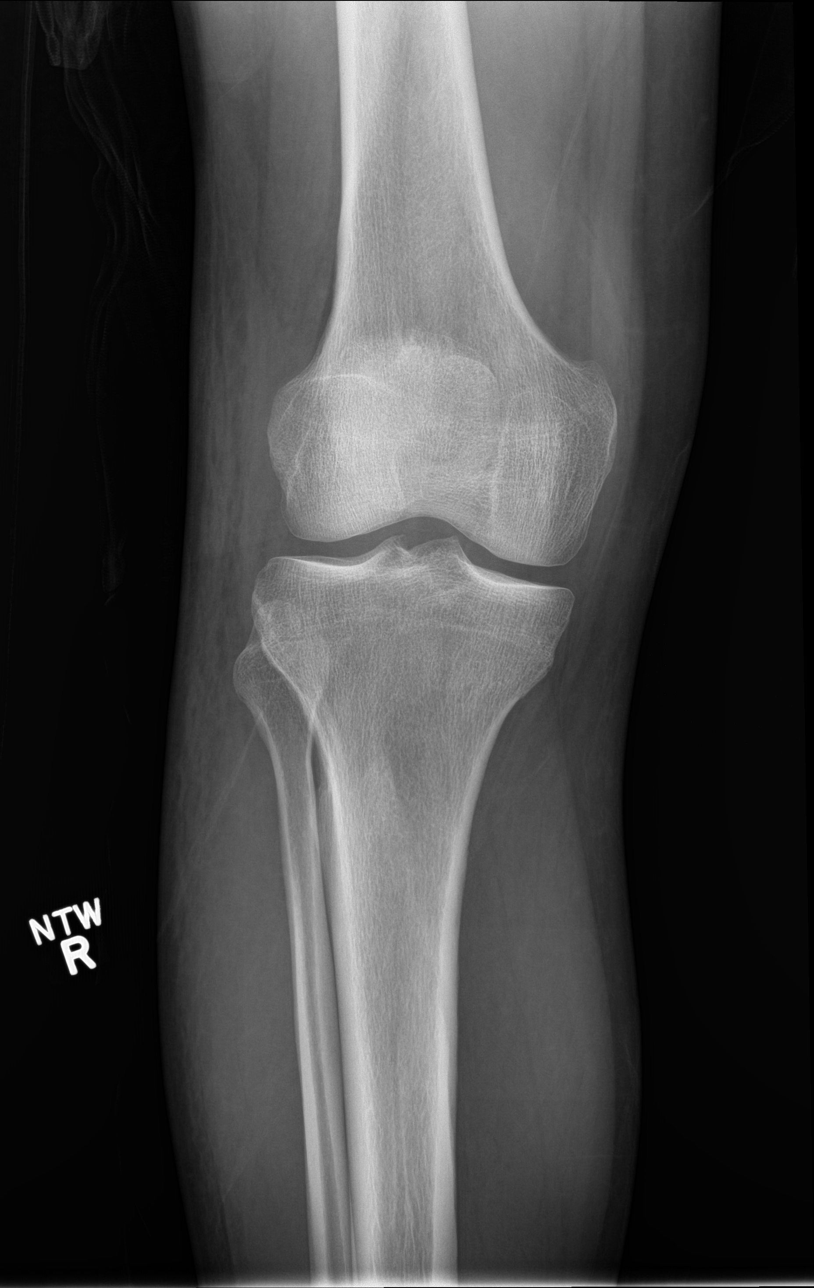

[knee obl (1 of 2)]
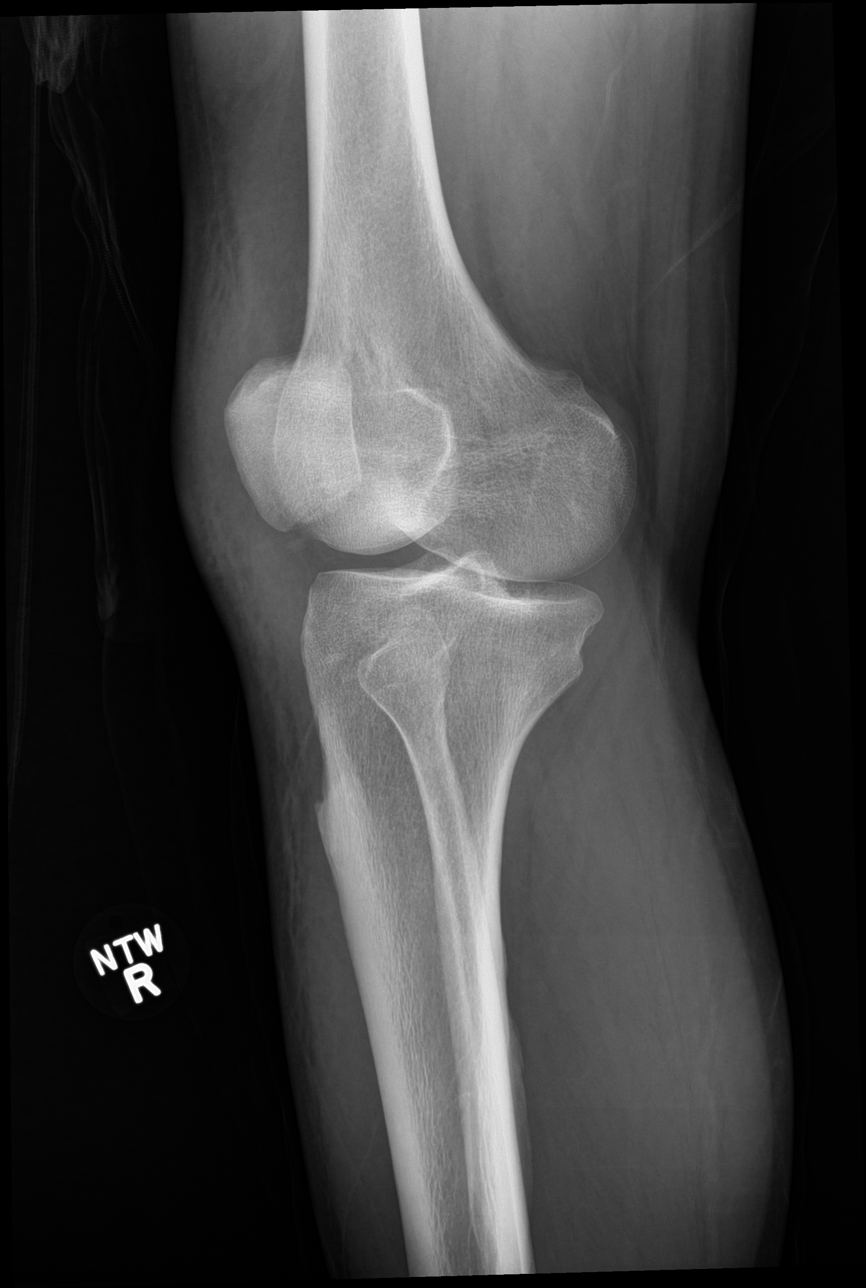

[knee obl (2 of 2)]
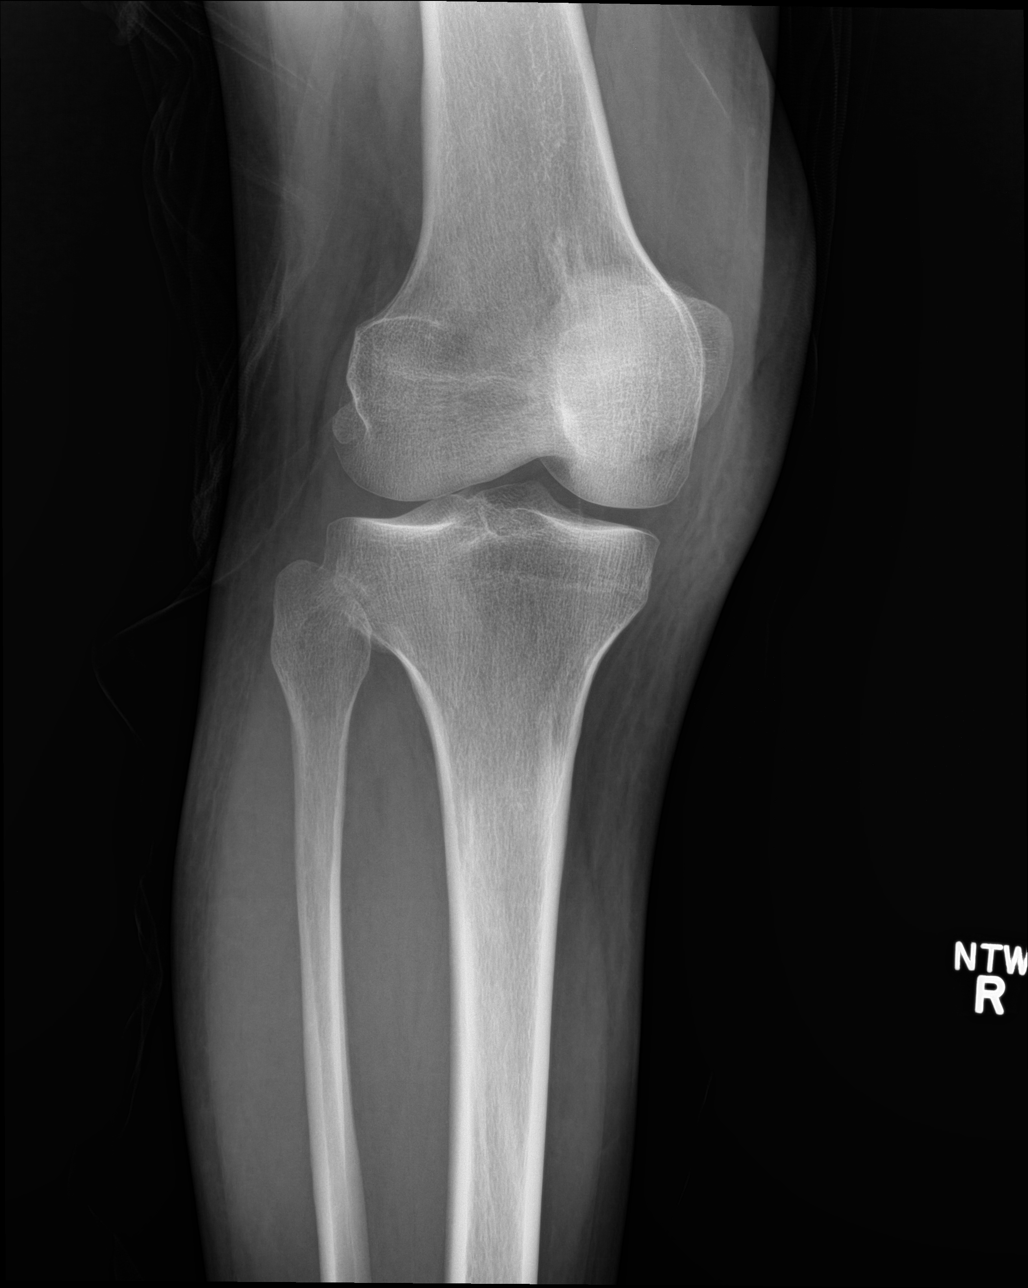

[knee lat]
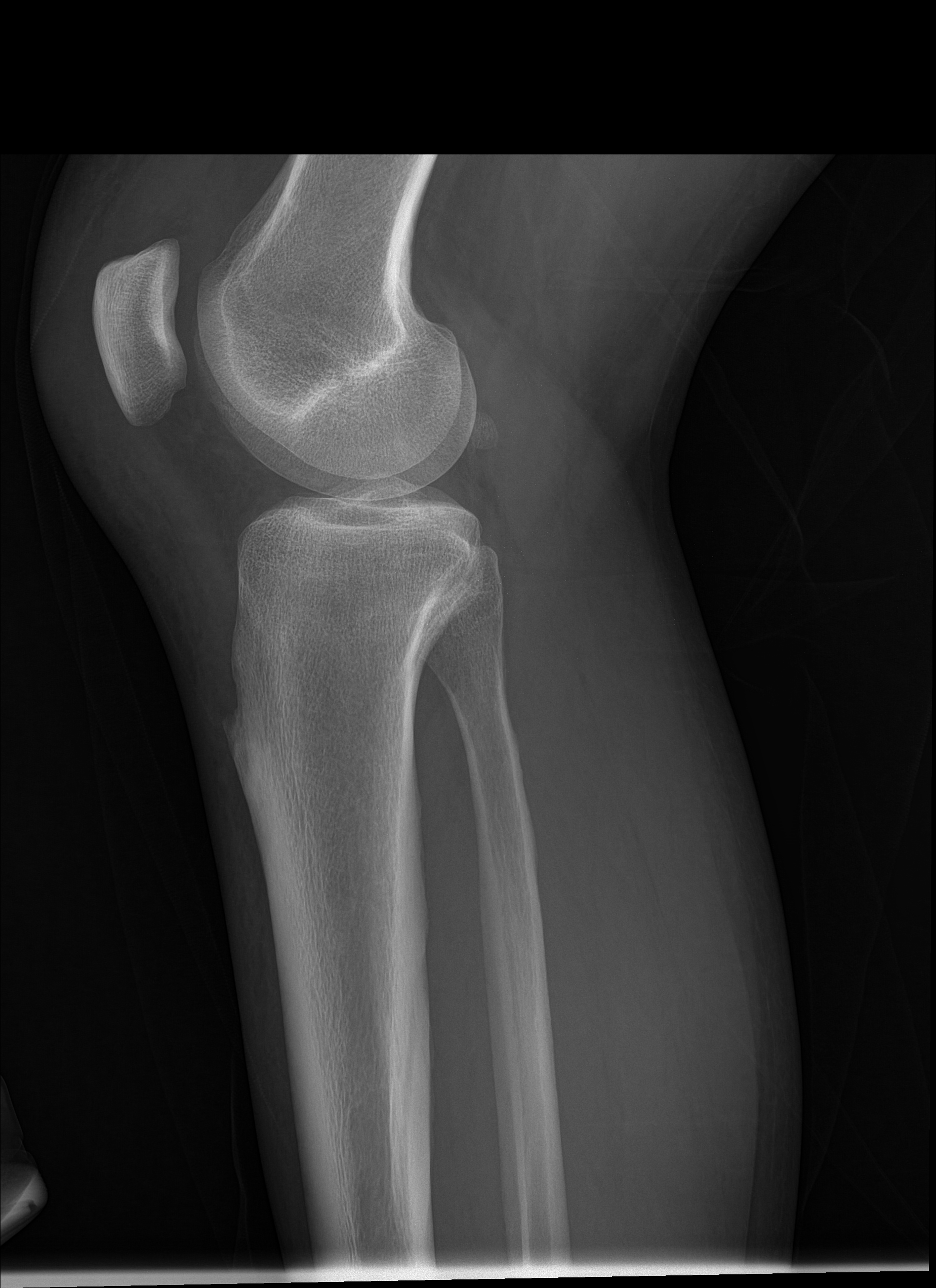

[4 of 4 positions shown; findings below may reference images not displayed]

FINDINGS: Frontal, lateral, and bilateral oblique views were obtained. There
is marked prepatellar soft tissue edema. There is a small joint
effusion. No fracture or dislocation. No joint space narrowing. No
erosive change or bony destruction.
IMPRESSION: Extensive prepatellar soft tissue swelling. No air seen in this
prepatellar soft tissue edema. There is a small joint effusion. No
bony destruction or erosion. No joint space narrowing. No fracture
or dislocation.

## 2018-04-21 ENCOUNTER — Emergency Department (HOSPITAL_COMMUNITY)
Admission: EM | Admit: 2018-04-21 | Discharge: 2018-04-22 | Disposition: A | Discharge status: Home / Self Care | Payer: Commercial Managed Care - PPO | Attending: Emergency Medicine | Admitting: Emergency Medicine

## 2018-04-21 ENCOUNTER — Emergency Department (HOSPITAL_COMMUNITY): Payer: Commercial Managed Care - PPO

## 2018-04-21 ENCOUNTER — Encounter (HOSPITAL_COMMUNITY): Payer: Self-pay | Admitting: Emergency Medicine

## 2018-04-21 DIAGNOSIS — R0982 Postnasal drip: Secondary | ICD-10-CM | POA: Diagnosis present

## 2018-04-21 DIAGNOSIS — J209 Acute bronchitis, unspecified: Secondary | ICD-10-CM | POA: Diagnosis not present

## 2018-04-21 DIAGNOSIS — F17210 Nicotine dependence, cigarettes, uncomplicated: Secondary | ICD-10-CM | POA: Insufficient documentation

## 2018-04-21 MED ORDER — PREDNISONE 50 MG PO TABS
60.0000 mg | ORAL_TABLET | Freq: Once | ORAL | Status: AC
  Administered 2018-04-21: 60 mg via ORAL
  Filled 2018-04-21: qty 1

## 2018-04-21 MED ORDER — ALBUTEROL SULFATE (2.5 MG/3ML) 0.083% IN NEBU
5.0000 mg | INHALATION_SOLUTION | Freq: Once | RESPIRATORY_TRACT | Status: AC
  Administered 2018-04-21: 5 mg via RESPIRATORY_TRACT
  Filled 2018-04-21: qty 6

## 2018-04-21 MED ORDER — IPRATROPIUM BROMIDE 0.02 % IN SOLN
0.5000 mg | Freq: Once | RESPIRATORY_TRACT | Status: AC
  Administered 2018-04-21: 0.5 mg via RESPIRATORY_TRACT
  Filled 2018-04-21: qty 2.5

## 2018-04-21 NOTE — ED Triage Notes (Signed)
Pt c/o chest congestion and prod cough x 2 days. Unknown color of sputum. Nad.

## 2018-04-22 MED ORDER — PREDNISONE 20 MG PO TABS: ORAL_TABLET | ORAL | 0 refills | Status: DC

## 2018-04-22 MED ORDER — AMOXICILLIN 500 MG PO CAPS: 500.0000 mg | ORAL_CAPSULE | Freq: Three times a day (TID) | ORAL | 0 refills | Status: DC

## 2018-04-22 MED ORDER — ALBUTEROL SULFATE HFA 108 (90 BASE) MCG/ACT IN AERS
2.0000 | INHALATION_SPRAY | Freq: Four times a day (QID) | RESPIRATORY_TRACT | Status: DC | PRN
  Administered 2018-04-22: 2 via RESPIRATORY_TRACT
  Filled 2018-04-22: qty 6.7

## 2018-04-22 MED ORDER — AEROCHAMBER Z-STAT PLUS/MEDIUM MISC
1.0000 | Freq: Once | Status: DC
  Filled 2018-04-22: qty 1

## 2018-04-22 MED ORDER — IPRATROPIUM BROMIDE 0.02 % IN SOLN
0.5000 mg | Freq: Once | RESPIRATORY_TRACT | Status: AC
  Administered 2018-04-22: 0.5 mg via RESPIRATORY_TRACT
  Filled 2018-04-22: qty 2.5

## 2018-04-22 MED ORDER — ALBUTEROL SULFATE (2.5 MG/3ML) 0.083% IN NEBU
5.0000 mg | INHALATION_SOLUTION | Freq: Once | RESPIRATORY_TRACT | Status: AC
  Administered 2018-04-22: 5 mg via RESPIRATORY_TRACT
  Filled 2018-04-22: qty 6

## 2018-04-22 NOTE — Discharge Instructions (Signed)
Use the inhaler 2 puffs every 4-6 hours as needed for wheezing or shortness of breath.  Take the antibiotic and the prednisone until gone.  Recheck if you get a fever, your breathing gets worse or you are not improving over the next several days.  You can take dextromethorphan for your cough.

## 2018-04-22 NOTE — ED Provider Notes (Signed)
[facility] EMERGENCY DEPARTMENT Provider Note   CSN: [id number] Arrival date & time: 04/21/18  2151  Time seen 11:44 PM   History   Chief Complaint Chief Complaint  Patient presents with  . Cough    HPI Robert Ferguson is a 43 y.o. male.  HPI patient states January 12 he started having postnasal drip, rhinorrhea, and sneezing which are all gone now.  He states he then had a sore throat but it is gone now also.  2 days ago he started taking Mucinex and NyQuil without relief.  He states he is coughing and it is dry.  He denies any fever.  He has been having wheezing and states he has had it in the past and has had to use an inhaler in the past.  He denies fever, nausea, vomiting, or diarrhea.  He states he feels very fatigued.  He states everyone he is around is sick  [facility]   Past Medical History:  Diagnosis Date  . Back pain   . Cellulitis of left leg 2008  . MRSA infection     Patient Active Problem List   Diagnosis Date Noted  . Cellulitis 06/28/2016  . Cellulitis of right lower extremity 09/24/2012  . Thrombocytopenia (HCC) 09/24/2012    Past Surgical History:  Procedure Laterality Date  . WISDOM TOOTH EXTRACTION          Home Medications    Prior to Admission medications   Medication Sig Start Date End Date Taking? Authorizing Provider  guaifenesin (ROBITUSSIN) 100 MG/5ML syrup Take 200 mg by mouth 3 (three) times daily as needed for cough.   Yes [provider]  amoxicillin (AMOXIL) 500 MG capsule Take 1 capsule (500 mg total) by mouth 3 (three) times daily. 04/22/18   Devoria Albe, MD  predniSONE (DELTASONE) 20 MG tablet Take 3 po QD x 3d , then 2 po QD x 3d then 1 po QD x 3d 04/22/18   Devoria Albe, MD    Family History History reviewed. No pertinent family history.  Social History Social History   Tobacco Use  . Smoking status: Current Some Day Smoker    Packs/day: 0.25    Years: 15.00    Pack years:  3.75    Types: Cigarettes  . Smokeless tobacco: Never Used  . Tobacco comment: 0.25ppw - refused nicotine  Substance Use Topics  . Alcohol use: Yes    Comment: Occ  . Drug use: No  employed States if he buys a pack of cigarettes they last several weeks   Allergies   Tramadol   Review of Systems Review of Systems  All other systems reviewed and are negative.    Physical Exam Updated Vital Signs BP (!) 157/99 (BP Location: Right Arm)   Pulse 74   Temp 97.8 F (36.6 C) (Oral)   Resp 17   SpO2 97%   Vital signs normal except mild hypertension   Physical Exam Vitals signs and nursing note reviewed.  Constitutional:      General: He is not in acute distress.    Appearance: Normal appearance. He is well-developed. He is not ill-appearing or toxic-appearing.  HENT:     Head: Normocephalic and atraumatic.     Right Ear: External ear normal.     Left Ear: External ear normal.     Nose: Nose normal. No mucosal edema or rhinorrhea.     Mouth/Throat:     Dentition: No dental abscesses.  Pharynx: No uvula swelling.  Eyes:     Conjunctiva/sclera: Conjunctivae normal.     Pupils: Pupils are equal, round, and reactive to light.  Neck:     Musculoskeletal: Full passive range of motion without pain, normal range of motion and neck supple.  Cardiovascular:     Rate and Rhythm: Normal rate and regular rhythm.     Heart sounds: Normal heart sounds. No murmur. No friction rub. No gallop.   Pulmonary:     Effort: Pulmonary effort is normal. No respiratory distress.     Breath sounds: Decreased air movement present. Decreased breath sounds, wheezing and rhonchi present. No rales.     Comments: Diffusely Chest:     Chest wall: No tenderness or crepitus.  Abdominal:     General: Bowel sounds are normal. There is no distension.     Palpations: Abdomen is soft.     Tenderness: There is no abdominal tenderness. There is no guarding or rebound.  Musculoskeletal: Normal range of  motion.        General: No tenderness.     Comments: Moves all extremities well.   Skin:    General: Skin is warm and dry.     Coloration: Skin is not pale.     Findings: No erythema or rash.  Neurological:     Mental Status: He is alert and oriented to person, place, and time.     Cranial Nerves: No cranial nerve deficit.  Psychiatric:        Mood and Affect: Mood is not anxious.        Speech: Speech normal.        Behavior: Behavior normal.      ED Treatments / Results  Labs (all labs ordered are listed, but only abnormal results are displayed) Labs Reviewed - No data to display  EKG None  Radiology Dg Chest 2 View  Result Date: 04/21/2018 CLINICAL DATA:  43 year old male with congestion and shortness of breath. EXAM: CHEST - 2 VIEW COMPARISON:  Chest radiograph dated 07/04/2016 FINDINGS: The heart size and mediastinal contours are within normal limits. Both lungs are clear. The visualized skeletal structures are unremarkable. IMPRESSION: No active cardiopulmonary disease. Electronically Signed   By: Elgie CollardArash  Radparvar M.D.   On: 04/21/2018 22:37    Procedures Procedures (including critical care time)  Medications Ordered in ED Medications  albuterol (PROVENTIL HFA;VENTOLIN HFA) 108 (90 Base) MCG/ACT inhaler 2 puff (2 puffs Inhalation Given 04/22/18 0056)  aerochamber Z-Stat Plus/medium 1 each (has no administration in time range)  albuterol (PROVENTIL) (2.5 MG/3ML) 0.083% nebulizer solution 5 mg (5 mg Nebulization Given 04/21/18 2357)  ipratropium (ATROVENT) nebulizer solution 0.5 mg (0.5 mg Nebulization Given 04/21/18 2357)  predniSONE (DELTASONE) tablet 60 mg (60 mg Oral Given 04/21/18 2354)  albuterol (PROVENTIL) (2.5 MG/3ML) 0.083% nebulizer solution 5 mg (5 mg Nebulization Given 04/22/18 0056)  ipratropium (ATROVENT) nebulizer solution 0.5 mg (0.5 mg Nebulization Given 04/22/18 0056)     Initial Impression / Assessment and Plan / ED Course  I have reviewed the triage  vital signs and the nursing notes.  Pertinent labs & imaging results that were available during my care of the patient were reviewed by me and considered in my medical decision making (see chart for details).     Patient was given an albuterol nebulizer treatment with Atrovent.  He also was given prednisone.  I am debating whether to put him on antibiotic, although he does not smoke heavily he does still  smoke so we will probably start him on an antibiotic.   12:45 AM patient was rechecked, he is finished his nebulizer treatment.  He states he is feeling better.  Listen to him he has improved air movement however he still has some wheezing and rhonchi at the bases bilaterally.  He received a second nebulizer.  I also have respiratory therapy show him how to use an inhaler and he was given a spacer to use.  Recheck at 1:55 AM patient now is clear, he is still coughing and states he is coughing up mucus now.  He was discharged home with instructions.  Final Clinical Impressions(s) / ED Diagnoses   Final diagnoses:  Acute bronchitis, unspecified organism    ED Discharge Orders         Ordered    predniSONE (DELTASONE) 20 MG tablet     04/22/18 0105    amoxicillin (AMOXIL) 500 MG capsule  3 times daily     04/22/18 0105          Plan discharge  Devoria Albe, MD, Concha Pyo, MD 04/22/18 [id number]

## 2018-09-07 ENCOUNTER — Emergency Department (HOSPITAL_COMMUNITY): Payer: Commercial Managed Care - PPO

## 2018-09-07 ENCOUNTER — Other Ambulatory Visit: Payer: Self-pay

## 2018-09-07 ENCOUNTER — Encounter (HOSPITAL_COMMUNITY): Payer: Self-pay | Admitting: Emergency Medicine

## 2018-09-07 ENCOUNTER — Emergency Department (HOSPITAL_COMMUNITY)
Admission: EM | Admit: 2018-09-07 | Discharge: 2018-09-07 | Disposition: A | Discharge status: Home / Self Care | Payer: Commercial Managed Care - PPO | Attending: Emergency Medicine | Admitting: Emergency Medicine

## 2018-09-07 DIAGNOSIS — F17210 Nicotine dependence, cigarettes, uncomplicated: Secondary | ICD-10-CM | POA: Diagnosis not present

## 2018-09-07 DIAGNOSIS — M545 Low back pain, unspecified: Secondary | ICD-10-CM

## 2018-09-07 DIAGNOSIS — M5489 Other dorsalgia: Secondary | ICD-10-CM | POA: Diagnosis present

## 2018-09-07 MED ORDER — CYCLOBENZAPRINE HCL 10 MG PO TABS
10.0000 mg | ORAL_TABLET | Freq: Once | ORAL | Status: AC
  Administered 2018-09-07: 10 mg via ORAL
  Filled 2018-09-07: qty 1

## 2018-09-07 MED ORDER — NAPROXEN 250 MG PO TABS: 250.0000 mg | ORAL_TABLET | Freq: Two times a day (BID) | ORAL | 0 refills | Status: DC | PRN

## 2018-09-07 MED ORDER — HYDROCODONE-ACETAMINOPHEN 5-325 MG PO TABS: ORAL_TABLET | ORAL | 0 refills | Status: AC

## 2018-09-07 MED ORDER — METHOCARBAMOL 500 MG PO TABS: 1000.0000 mg | ORAL_TABLET | Freq: Four times a day (QID) | ORAL | 0 refills | Status: DC | PRN

## 2018-09-07 NOTE — Discharge Instructions (Signed)
Take the prescriptions as directed.  Apply moist heat or ice to the area(s) of discomfort, for 15 minutes at a time, several times per day for the next few days.  Do not fall asleep on a heating or ice pack.  Call your regular medical doctor today to schedule a follow up appointment this week.  Return to the Emergency Department immediately if worsening. ° °

## 2018-09-07 NOTE — ED Triage Notes (Signed)
Pt c/o back pain for several weeks but has really worsened the last few days. No injury.

## 2018-09-07 NOTE — ED Provider Notes (Signed)
[facility]ERGENCY DEPARTMENT Provider Note   CSN: [id number] Arrival date & time: 09/07/18  [id number]    History   Chief Complaint Chief Complaint  Patient presents with  . Back Pain    HPI Robert Ferguson is a 43 y.o. male.     HPI  Pt was seen at 0725. Per pt, c/o gradual onset and persistence of constant acute flair of his chronic low back "pain" for the past 1 month, worse over the past 1 week.  Denies any change in his usual chronic pain pattern.  Pain worsens with palpation of the area and body position changes. Denies incont/retention of bowel or bladder, no saddle anesthesia, no focal motor weakness, no tingling/numbness in extremities, no fevers, no injury, no abd pain.   The symptoms have been associated with no other complaints. The patient has a significant history of similar symptoms previously, recently being evaluated for this complaint and multiple prior evals for same.    Past Medical History:  Diagnosis Date  . Back pain   . Cellulitis of left leg 2008  . MRSA infection     Patient Active Problem List   Diagnosis Date Noted  . Cellulitis 06/28/2016  . Cellulitis of right lower extremity 09/24/2012  . Thrombocytopenia (HCC) 09/24/2012    Past Surgical History:  Procedure Laterality Date  . WISDOM TOOTH EXTRACTION          Home Medications    Prior to Admission medications   Medication Sig Start Date End Date Taking? Authorizing Provider  amoxicillin (AMOXIL) 500 MG capsule Take 1 capsule (500 mg total) by mouth 3 (three) times daily. 04/22/18   Devoria AlbeKnapp, Iva, MD  guaifenesin (ROBITUSSIN) 100 MG/5ML syrup Take 200 mg by mouth 3 (three) times daily as needed for cough.    [provider]  predniSONE (DELTASONE) 20 MG tablet Take 3 po QD x 3d , then 2 po QD x 3d then 1 po QD x 3d 04/22/18   Devoria AlbeKnapp, Iva, MD    Family History No family history on file.  Social History Social History   Tobacco Use  . Smoking status: Current Some Day Smoker   Packs/day: 0.25    Years: 15.00    Pack years: 3.75    Types: Cigarettes  . Smokeless tobacco: Never Used  . Tobacco comment: 0.25ppw - refused nicotine  Substance Use Topics  . Alcohol use: Yes    Comment: Occ  . Drug use: No     Allergies   Tramadol   Review of Systems Review of Systems ROS: Statement: All systems negative except as marked or noted in the HPI; Constitutional: Negative for fever and chills. ; ; Eyes: Negative for eye pain, redness and discharge. ; ; ENMT: Negative for ear pain, hoarseness, nasal congestion, sinus pressure and sore throat. ; ; Cardiovascular: Negative for chest pain, palpitations, diaphoresis, dyspnea and peripheral edema. ; ; Respiratory: Negative for cough, wheezing and stridor. ; ; Gastrointestinal: Negative for nausea, vomiting, diarrhea, abdominal pain, blood in stool, hematemesis, jaundice and rectal bleeding. . ; ; Genitourinary: Negative for dysuria, flank pain and hematuria. ; ; Musculoskeletal: +LBP. Negative for neck pain. Negative for swelling and trauma.; ; Skin: Negative for pruritus, rash, abrasions, blisters, bruising and skin lesion.; ; Neuro: Negative for headache, lightheadedness and neck stiffness. Negative for weakness, altered level of consciousness, altered mental status, extremity weakness, paresthesias, involuntary movement, seizure and syncope.       Physical Exam Updated Vital Signs BP Marland Kitchen(!)  167/104 (BP Location: Left Arm)   Pulse 76   Temp 98 F (36.7 C) (Oral)   Resp 16   Ht 6' (1.829 m)   Wt 99.8 kg   SpO2 99%   BMI 29.84 kg/m   Physical Exam 0730: Physical examination:  Nursing notes reviewed; Vital signs and O2 SAT reviewed;  Constitutional: Well developed, Well nourished, Well hydrated, In no acute distress; Head:  Normocephalic, atraumatic; Eyes: EOMI, PERRL, No scleral icterus; ENMT: Mouth and pharynx normal, Mucous membranes moist; Neck: Supple, Full range of motion, No lymphadenopathy; Cardiovascular: Regular  rate and rhythm, No gallop; Respiratory: Breath sounds clear & equal bilaterally, No wheezes.  Speaking full sentences with ease, Normal respiratory effort/excursion; Chest: Nontender, Movement normal; Abdomen: Soft, Nontender, Nondistended, Normal bowel sounds; Genitourinary: No CVA tenderness; Spine:  No midline CS, TS, LS tenderness. +mild TTP right lumbar paraspinal muscles.;; Extremities: Peripheral pulses normal, No tenderness, No edema, No calf edema or asymmetry.; Neuro: AA&Ox3, Major CN grossly intact.  Speech clear. No gross focal motor or sensory deficits in extremities. Strength 5/5 equal bilat UE's and LE's, including great toe dorsiflexion.  DTR 2/4 equal bilat UE's and LE's.  No gross sensory deficits.  Neg straight leg raises bilat..; Skin: Color normal, Warm, Dry.   ED Treatments / Results  Labs (all labs ordered are listed, but only abnormal results are displayed)   EKG None  Radiology   Procedures Procedures (including critical care time)  Medications Ordered in ED Medications  cyclobenzaprine (FLEXERIL) tablet 10 mg (has no administration in time range)     Initial Impression / Assessment and Plan / ED Course  I have reviewed the triage vital signs and the nursing notes.  Pertinent labs & imaging results that were available during my care of the patient were reviewed by me and considered in my medical decision making (see chart for details).     MDM Reviewed: previous chart, nursing note and vitals Interpretation: x-ray    Dg Lumbar Spine Complete Result Date: 09/07/2018 CLINICAL DATA:  Chronic low back pain EXAM: LUMBAR SPINE - COMPLETE 4+ VIEW COMPARISON:  None. FINDINGS: There is no evidence of lumbar spine fracture. Alignment is normal. Mild degenerative disc disease with disc height loss at L4-5 and L5-S1 with bilateral facet arthropathy. 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  Electronically Signed   By: Elige Ko   On: 09/07/2018 08:47    Robert Herald was evaluated in Emergency Department on 09/07/2018 for the symptoms described in the history of present illness. He was evaluated in the context of the global COVID-19 pandemic, which necessitated consideration that the patient might be at risk for infection with the SARS-CoV-2 virus that causes COVID-19. Institutional protocols and algorithms that pertain to the evaluation of patients at risk for COVID-19 are in a state of rapid change based on information released by regulatory bodies including the CDC and federal and state organizations. These policies and algorithms were followed during the patient's care in the ED.   0900:  XR as above. No red flags. Tx symptomatically, f/u PMD. Dx and testing, d/w pt.  Questions answered.  Verb understanding, agreeable to d/c home with outpt f/u.   Final Clinical Impressions(s) / ED Diagnoses   Final diagnoses:  None    ED Discharge Orders    None       Samuel Jester, DO 09/08/18 1834

## 2018-09-17 ENCOUNTER — Other Ambulatory Visit: Payer: Self-pay

## 2018-09-17 ENCOUNTER — Encounter (HOSPITAL_COMMUNITY): Payer: Self-pay | Admitting: Emergency Medicine

## 2018-09-17 ENCOUNTER — Emergency Department (HOSPITAL_COMMUNITY)
Admission: EM | Admit: 2018-09-17 | Discharge: 2018-09-17 | Disposition: A | Discharge status: Home / Self Care | Payer: Commercial Managed Care - PPO | Attending: Emergency Medicine | Admitting: Emergency Medicine

## 2018-09-17 DIAGNOSIS — F17210 Nicotine dependence, cigarettes, uncomplicated: Secondary | ICD-10-CM | POA: Diagnosis not present

## 2018-09-17 DIAGNOSIS — M5441 Lumbago with sciatica, right side: Secondary | ICD-10-CM | POA: Insufficient documentation

## 2018-09-17 DIAGNOSIS — L03221 Cellulitis of neck: Secondary | ICD-10-CM | POA: Diagnosis not present

## 2018-09-17 DIAGNOSIS — L03811 Cellulitis of head [any part, except face]: Secondary | ICD-10-CM | POA: Insufficient documentation

## 2018-09-17 DIAGNOSIS — M545 Low back pain: Secondary | ICD-10-CM | POA: Diagnosis present

## 2018-09-17 MED ORDER — METHOCARBAMOL 500 MG PO TABS: 1000.0000 mg | ORAL_TABLET | Freq: Four times a day (QID) | ORAL | 0 refills | Status: AC | PRN

## 2018-09-17 MED ORDER — OXYCODONE HCL 5 MG PO TABS
10.0000 mg | ORAL_TABLET | Freq: Once | ORAL | Status: AC
  Administered 2018-09-17: 10 mg via ORAL
  Filled 2018-09-17: qty 2

## 2018-09-17 MED ORDER — NAPROXEN 250 MG PO TABS: 250.0000 mg | ORAL_TABLET | Freq: Two times a day (BID) | ORAL | 0 refills | Status: DC | PRN

## 2018-09-17 MED ORDER — DOXYCYCLINE HYCLATE 100 MG PO CAPS: 100.0000 mg | ORAL_CAPSULE | Freq: Two times a day (BID) | ORAL | 0 refills | Status: DC

## 2018-09-17 NOTE — Discharge Instructions (Addendum)
1. Medications: robaxin, naproxyn, doxycycline, usual home medications 2. Treatment: rest, drink plenty of fluids, gentle stretching as discussed, alternate ice and heat 3. Follow Up: Please followup with Ortho and/or neurosurgery in 5-7 days for discussion of your diagnoses and further evaluation after today's visit; if you do not have a primary care doctor use the resource guide provided to find one;  Return to the ER for worsening back pain, difficulty walking, loss of bowel or bladder control or other concerning symptoms

## 2018-09-17 NOTE — ED Notes (Signed)
Pt back pain, also with abscesses to back of neck for past 2-3 days-area hard and red

## 2018-09-17 NOTE — ED Triage Notes (Signed)
Patient reports continuing back pain without relief from medications. Patient also reports abscesses to the back of his neck that started when he began steroid medications.

## 2018-09-17 NOTE — ED Provider Notes (Signed)
[facility] EMERGENCY DEPARTMENT Provider Note   CSN: [id number] Arrival date & time: 09/17/18  1429    History   Chief Complaint Chief Complaint  Patient presents with  . Back Pain  . Abscess    HPI Robert Ferguson is a 43 y.o. male with a hx of chronic back pain, MRSA skin infections presents to the Emergency Department complaining of gradual, persistent, progressively worsening low back pain with radiation down the right leg onset early June.  Pt reports he has been seen both by the ED and his PCP for this.  Pt reports he is taking Robaxin and hydrocodone with some relief of his pain, but pain returns after the medication wears off.  Pt reports no weakness in the leg, but occasional tingling in the right leg, specifically with sitting.  He denies numbness or paresthesias of his saddle region or left leg.  He denies loss of bowel or bladder control, urinary retention.  He denies hx of cancer, IVDU, blood thinners or known trauma.  In addition, pt is also taking a second round of prednisone for his sciatica without relief.  Pt reports he does not feel this has helped his symptoms at all.  Pt reports moving, walking, and palpation make his pain worse.  He has never seen an orthopedist or neurosurgeon for his back pain. Record review shows several previous evaluations in the ED for acute on chronic low back pain over the years. Record reviewed.  Pt evaluated in the ED on 09/07/2018 for this pain.  Plain films during that visit were without fracture or acute abnormality.   Pt also c/o infection to his neck and scalp at the base of his hairline.  Pt reports a hx of MRSA infections and reports these always worsen/occur with prednisone.  Pt reports no evaluation or treatment for this infection.  No treatments at home.  Pt is unsure if it has been draining.  Pt denies fever, chills, headache, MSK neck pain, neck stiffness, abd pain, N/V/D.      The history is provided by the patient and medical records.  No language interpreter was used.    Past Medical History:  Diagnosis Date  . Back pain   . Cellulitis of left leg 2008  . MRSA infection     Patient Active Problem List   Diagnosis Date Noted  . Cellulitis 06/28/2016  . Cellulitis of right lower extremity 09/24/2012  . Thrombocytopenia ([place]) 09/24/2012    Past Surgical History:  Procedure Laterality Date  . WISDOM TOOTH EXTRACTION          Home Medications    Prior to Admission medications   Medication Sig Start Date End Date Taking? Authorizing Provider  doxycycline (VIBRAMYCIN) 100 MG capsule Take 1 capsule (100 mg total) by mouth 2 (two) times daily. 09/17/18   Lerone Onder, Jarrett Soho, PA-C  guaifenesin (ROBITUSSIN) 100 MG/5ML syrup Take 200 mg by mouth 3 (three) times daily as needed for cough.    [provider]  HYDROcodone-acetaminophen (NORCO/VICODIN) 5-325 MG tablet 1 or 2 tabs PO q8 hours prn pain 09/07/18   Francine Graven, DO  methocarbamol (ROBAXIN) 500 MG tablet Take 2 tablets (1,000 mg total) by mouth 4 (four) times daily as needed for muscle spasms (muscle spasm/pain). 09/17/18   Michayla Mcneil, Jarrett Soho, PA-C  naproxen (NAPROSYN) 250 MG tablet Take 1 tablet (250 mg total) by mouth 2 (two) times daily as needed for mild pain or moderate pain (take with food). 09/17/18   Emiline Mancebo, Jarrett Soho,  PA-C    Family History History reviewed. No pertinent family history.  Social History Social History   Tobacco Use  . Smoking status: Current Some Day Smoker    Packs/day: 0.25    Years: 15.00    Pack years: 3.75    Types: Cigarettes  . Smokeless tobacco: Never Used  . Tobacco comment: 0.25ppw - refused nicotine  Substance Use Topics  . Alcohol use: Yes    Comment: Occ  . Drug use: No     Allergies   Tramadol   Review of Systems Review of Systems  Constitutional: Negative for chills, fatigue and fever.  HENT: Negative for facial swelling.   Respiratory: Negative for chest tightness and shortness of  breath.   Cardiovascular: Negative for chest pain.  Gastrointestinal: Negative for abdominal pain, diarrhea, nausea and vomiting.  Endocrine: Negative for polydipsia, polyphagia and polyuria.  Genitourinary: Negative for dysuria, frequency, hematuria and urgency.  Musculoskeletal: Positive for back pain. Negative for gait problem, joint swelling, neck pain and neck stiffness.  Skin: Positive for color change and wound. Negative for rash.       Abscess  Allergic/Immunologic: Negative for immunocompromised state.  Neurological: Negative for weakness, light-headedness, numbness and headaches.  Hematological: Does not bruise/bleed easily.  Psychiatric/Behavioral: The patient is not nervous/anxious.   All other systems reviewed and are negative.    Physical Exam Updated Vital Signs BP 96/81   Pulse (!) 105   Temp 97.6 F (36.4 C) (Oral)   Resp 20   SpO2 99%   Physical Exam Vitals signs and nursing note reviewed.  Constitutional:      General: He is not in acute distress.    Appearance: He is well-developed. He is not diaphoretic.  HENT:     Head: Normocephalic and atraumatic.     Mouth/Throat:     Pharynx: No oropharyngeal exudate.  Eyes:     Conjunctiva/sclera: Conjunctivae normal.  Neck:     Musculoskeletal: Normal range of motion and neck supple. Normal range of motion. No spinous process tenderness or muscular tenderness.      Comments: Full ROM without pain No midline or paraspinal tenderness Cardiovascular:     Rate and Rhythm: Regular rhythm. Tachycardia present.     Comments: Mild tachycardia (101 bpm) on clinical exam Pulmonary:     Effort: Pulmonary effort is normal. No respiratory distress.  Abdominal:     General: There is no distension.     Palpations: Abdomen is soft.     Tenderness: There is no abdominal tenderness.  Musculoskeletal:     Comments: Full range of motion of the T-spine and L-spine No midline tenderness to the  T-spine or L-spine Tenderness  to palpation of the right paraspinous muscles of the L-spine. Palpation of the Right buttock elicits shooting pain and reproduction of the "tingling"  Lymphadenopathy:     Cervical: No cervical adenopathy.  Skin:    General: Skin is warm and dry.     Comments: Numerous small pustules noted at the base of the scalp/cephalic portion of the neck  Neurological:     Mental Status: He is alert.     Comments: Speech is clear and goal oriented, follows commands Normal 5/5 strength in upper and lower extremities bilaterally including dorsiflexion and plantar flexion, strong and equal grip strength Sensation normal to light and sharp touch Moves extremities without ataxia, coordination intact Antalgic gait Normal balance No Clonus  Psychiatric:        Behavior: Behavior normal.  ED Treatments / Results   Procedures Procedures (including critical care time)  Medications Ordered in ED Medications  oxyCODONE (Oxy IR/ROXICODONE) immediate release tablet 10 mg (10 mg Oral Given 09/17/18 1646)     Initial Impression / Assessment and Plan / ED Course  I have reviewed the triage vital signs and the nursing notes.  Pertinent labs & imaging results that were available during my care of the patient were reviewed by me and considered in my medical decision making (see chart for details).  Clinical Course as of Sep 17 1702  Sat Sep 17, 2018  1642 Pt with several prescriptions of hydrocodone in June 2020.  Will not write for additional today.     [HM]    Clinical Course User Index [HM] Ariam Mol, Dahlia ClientHannah, [place] JerseyPA-C        Patient with back pain.  No neurological deficits. Normal strength.  Patient can walk but states is painful.  Pain control given in the ED. No loss of bowel or bladder control.  No concern for cauda equina.  No fever, night sweats, weight loss, h/o cancer, IVDU.  Pt has had several prescriptions for hydrocodone in the last several weeks.  Will not write additional  narcotics.  Did agree to refill robaxin and naprosyn.  Pt given referrals to Ortho and Neurosurg for further evaluation of his acute on chronic back pain.  Discussed red flags and reasons to return to the ED.  Pt additionally with area of cellulitis/folliculitis at the base of the scalp/top of the posterior neck.  Multiple sites of pustules but no large abscesses which would be amenable to I&D.  No drainage from the sites.  Pt with Hx of MRSA.  Rx for doxy.  Pt afebrile and no indication of sepsis at this time.  Warm compresses encouraged and 2 day wound check by PCP or in ED.  Pt is to return sooner for fevers, worsening pain, spreading infection or other concerns.  Pt states understanding and is in agreement with the plan.  Final Clinical Impressions(s) / ED Diagnoses   Final diagnoses:  Acute right-sided low back pain with right-sided sciatica  Cellulitis of multiple sites of scalp and neck    ED Discharge Orders         Ordered    methocarbamol (ROBAXIN) 500 MG tablet  4 times daily PRN     09/17/18 1644    naproxen (NAPROSYN) 250 MG tablet  2 times daily PRN     09/17/18 1644    doxycycline (VIBRAMYCIN) 100 MG capsule  2 times daily     09/17/18 1644           Levetta Bognar, Boyd KerbsHannah, PA-C 09/17/18 1708    Samuel JesterMcManus, Kathleen, DO 09/21/18 1458

## 2018-09-28 ENCOUNTER — Other Ambulatory Visit (HOSPITAL_COMMUNITY): Payer: Self-pay | Admitting: Neurological Surgery

## 2018-09-28 ENCOUNTER — Other Ambulatory Visit: Payer: Self-pay | Admitting: Neurological Surgery

## 2018-09-28 DIAGNOSIS — M5416 Radiculopathy, lumbar region: Secondary | ICD-10-CM

## 2018-10-04 ENCOUNTER — Other Ambulatory Visit: Payer: Self-pay

## 2018-10-04 ENCOUNTER — Ambulatory Visit (HOSPITAL_COMMUNITY)
Admission: RE | Admit: 2018-10-04 | Discharge: 2018-10-04 | Disposition: A | Discharge status: Home / Self Care | Payer: Commercial Managed Care - PPO | Source: Ambulatory Visit | Attending: Neurological Surgery | Admitting: Neurological Surgery

## 2018-10-04 DIAGNOSIS — M5416 Radiculopathy, lumbar region: Secondary | ICD-10-CM | POA: Diagnosis present

## 2020-08-31 ENCOUNTER — Encounter (HOSPITAL_COMMUNITY): Payer: Self-pay | Admitting: *Deleted

## 2020-08-31 ENCOUNTER — Other Ambulatory Visit: Payer: Self-pay

## 2020-08-31 ENCOUNTER — Emergency Department (HOSPITAL_COMMUNITY)
Admission: EM | Admit: 2020-08-31 | Discharge: 2020-08-31 | Disposition: A | Discharge status: Home / Self Care | Payer: Commercial Managed Care - PPO | Attending: Emergency Medicine | Admitting: Emergency Medicine

## 2020-08-31 DIAGNOSIS — F17210 Nicotine dependence, cigarettes, uncomplicated: Secondary | ICD-10-CM | POA: Insufficient documentation

## 2020-08-31 DIAGNOSIS — R5383 Other fatigue: Secondary | ICD-10-CM | POA: Insufficient documentation

## 2020-08-31 DIAGNOSIS — R600 Localized edema: Secondary | ICD-10-CM | POA: Insufficient documentation

## 2020-08-31 DIAGNOSIS — R21 Rash and other nonspecific skin eruption: Secondary | ICD-10-CM | POA: Insufficient documentation

## 2020-08-31 MED ORDER — DOXYCYCLINE HYCLATE 100 MG PO CAPS: 100.0000 mg | ORAL_CAPSULE | Freq: Two times a day (BID) | ORAL | 0 refills | Status: AC

## 2020-08-31 NOTE — ED Notes (Signed)
Pt denies pain only stating "I just feel tired. I slept 8 hours woke up and felt like I had no sleep. I went to work and felt drained so I took a nap when I got home and I still feel just exhausted. I always get this way when I have cellulitis."

## 2020-08-31 NOTE — ED Provider Notes (Signed)
[facility] EMERGENCY DEPARTMENT Provider Note   CSN: [id number] Arrival date & time: 08/31/20  1854     History Chief Complaint  Patient presents with  . Fatigue    Robert Ferguson is a 45 y.o. male.  HPI   Patient with significant medical history of cellulitis of the left leg, MRSA infection, presents with chief complaint of not feeling well.  Patient states yesterday he started to feel tired, and this morning it got worse, he also noted on the the medial aspect of his left lower leg had a small rash, he states it does not burn, does not itch, denies any discharge from the area but he states this is generally how he feels when he has cellulitis.  He denies systemic infection like fevers or chills, denies nasal congestion, sore throat or cough, denies denies recent sick contacts.  He has no history of IV drug use, is not immunocompromise.  Patient denies alleviating factors.  Patient denies headaches, chest pain, shortness breath, abdominal pain, nausea, vomit, diarrhea, urinary symptoms.  Past Medical History:  Diagnosis Date  . Back pain   . Cellulitis of left leg 2008  . MRSA infection     Patient Active Problem List   Diagnosis Date Noted  . Cellulitis 06/28/2016  . Cellulitis of right lower extremity 09/24/2012  . Thrombocytopenia (HCC) 09/24/2012    Past Surgical History:  Procedure Laterality Date  . WISDOM TOOTH EXTRACTION         History reviewed. No pertinent family history.  Social History   Tobacco Use  . Smoking status: Current Some Day Smoker    Packs/day: 0.25    Years: 15.00    Pack years: 3.75    Types: Cigarettes  . Smokeless tobacco: Never Used  . Tobacco comment: 0.25ppw - refused nicotine  Vaping Use  . Vaping Use: Never used  Substance Use Topics  . Alcohol use: Yes    Comment: Occ  . Drug use: No    Home Medications Prior to Admission medications   Medication Sig Start Date End Date Taking? Authorizing Provider  doxycycline  (VIBRAMYCIN) 100 MG capsule Take 1 capsule (100 mg total) by mouth 2 (two) times daily for 7 days. 08/31/20 09/07/20 Yes Carroll Sage, PA-C  guaifenesin (ROBITUSSIN) 100 MG/5ML syrup Take 200 mg by mouth 3 (three) times daily as needed for cough.    [provider]  HYDROcodone-acetaminophen (NORCO/VICODIN) 5-325 MG tablet 1 or 2 tabs PO q8 hours prn pain 09/07/18   Samuel Jester, DO  methocarbamol (ROBAXIN) 500 MG tablet Take 2 tablets (1,000 mg total) by mouth 4 (four) times daily as needed for muscle spasms (muscle spasm/pain). 09/17/18   Muthersbaugh, Dahlia Client, PA-C  naproxen (NAPROSYN) 250 MG tablet Take 1 tablet (250 mg total) by mouth 2 (two) times daily as needed for mild pain or moderate pain (take with food). 09/17/18   Muthersbaugh, Dahlia Client, PA-C    Allergies    Tramadol  Review of Systems   Review of Systems  Constitutional: Positive for fatigue. Negative for chills and fever.  HENT: Negative for congestion and sore throat.   Respiratory: Negative for shortness of breath.   Cardiovascular: Negative for chest pain.  Gastrointestinal: Negative for abdominal pain.  Genitourinary: Negative for enuresis.  Musculoskeletal: Negative for back pain.  Skin: Positive for rash.       Left lower leg  Neurological: Negative for dizziness and headaches.  Hematological: Does not bruise/bleed easily.    Physical Exam  Updated Vital Signs BP (!) 151/92 (BP Location: Right Arm)   Pulse 100   Temp 98.2 F (36.8 C) (Oral)   Resp 16   Ht 6' (1.829 m)   Wt 99.8 kg   SpO2 98%   BMI 29.84 kg/m   Physical Exam Vitals and nursing note reviewed.  Constitutional:      General: He is not in acute distress.    Appearance: He is not ill-appearing.  HENT:     Head: Normocephalic and atraumatic.     Nose: No congestion.  Eyes:     Conjunctiva/sclera: Conjunctivae normal.  Cardiovascular:     Rate and Rhythm: Normal rate and regular rhythm.     Pulses: Normal pulses.     Heart  sounds: No murmur heard. No friction rub. No gallop.   Pulmonary:     Effort: No respiratory distress.     Breath sounds: No wheezing, rhonchi or rales.  Musculoskeletal:     Right lower leg: Edema present.     Comments: Patient's lower extremities were visualized he has noted pedal edema the right lower foot, 1+ pitting edema, no chronic lymphedema changes, neurovascularly intact.  Skin:    General: Skin is warm and dry.     Findings: Rash present.     Comments: Patient's left lower extreme was visualized he has a small macule like rash on the lateral medial aspect of his left lower leg, there is no noted drainage, discharge, there is not warm to the touch, no breakage in skin present.  Neurological:     Mental Status: He is alert.  Psychiatric:        Mood and Affect: Mood normal.     ED Results / Procedures / Treatments   Labs (all labs ordered are listed, but only abnormal results are displayed) Labs Reviewed - No data to display  EKG None  Radiology No results found.  Procedures Procedures   Medications Ordered in ED Medications - No data to display  ED Course  I have reviewed the triage vital signs and the nursing notes.  Pertinent labs & imaging results that were available during my care of the patient were reviewed by me and considered in my medical decision making (see chart for details).    MDM Rules/Calculators/A&P                         Initial impression-patient presents with concerns of cellulitis.  He is alert, does not appear in acute distress, vital signs reassuring.  Work-up-due to well-appearing patient, benign physical exam, further lab or imaging not warranted at this time.  Rule out-I have low suspicion for anaphylactic shock as vital signs are reassuring, no systemic rash present on exam, patient denies GI symptoms like nausea vomiting.  Low suspicion for Trudie Buckler or TEN as there is no noted skin sloughing on my exam.  Low suspicion for  systemic infection as patient is nontoxic-appearing, vital signs reassuring.  Low suspicion for abscess there is no  fluctuance or indurations present.  Low suspicion for PE as patient denies pleuritic chest pain, shortness of breath, vital signs reassuring.  Plan-  1.  Rash-unclear etiology possible this could be the beginning of a cellulitis, will provide patient with antibiotics but have him withhold it unless his symptoms do not improve after 3 days time.  will have him follow-up PCP for further evaluation.  2.  Right foot swelling-suspect lymphedema but cannot exclude possibility of  a DVT, will have him come back for ultrasound of right leg tomorrow.  Risks outweigh benefits for anticoagulant at this time.  Vital signs have remained stable, no indication for hospital admission.  Patient given at home care as well strict return precautions.  Patient verbalized that they understood agreed to said plan.   Final Clinical Impression(s) / ED Diagnoses Final diagnoses:  Rash    Rx / DC Orders ED Discharge Orders         Ordered    doxycycline (VIBRAMYCIN) 100 MG capsule  2 times daily        08/31/20 1945    US Venous Img Lower Unilateral Right        08/31/20 1945           Barnie Del 08/31/20 1948    Eber Hong, MD 09/06/20 1730

## 2020-08-31 NOTE — ED Triage Notes (Addendum)
Pt with feeling fatigue for past couple of days.  Pt states hx of cellulitis and believes he may have the beginning of it again to bilateral ankles.

## 2020-08-31 NOTE — Discharge Instructions (Signed)
Exam looks reassuring, it is possible you might have cellulitis, given you a prescription for doxycycline, please withhold the medication for the next 3 days if symptoms do not improve please start the medication.  For pain you may take over-the-counter pain medication like ibuprofen and or Tylenol every 6 hours as needed.  I want to have an ultrasound of your right leg, please call the number attached this number to schedule an ultrasound for tomorrow  Come back to the emergency department if you develop chest pain, shortness of breath, severe abdominal pain, uncontrolled nausea, vomiting, diarrhea.

## 2021-09-27 ENCOUNTER — Other Ambulatory Visit: Payer: Self-pay

## 2021-09-27 ENCOUNTER — Emergency Department (HOSPITAL_COMMUNITY): Payer: PRIVATE HEALTH INSURANCE

## 2021-09-27 ENCOUNTER — Encounter (HOSPITAL_COMMUNITY): Payer: Self-pay

## 2021-09-27 ENCOUNTER — Emergency Department (HOSPITAL_COMMUNITY)
Admission: EM | Admit: 2021-09-27 | Discharge: 2021-09-27 | Disposition: A | Discharge status: Home / Self Care | Payer: PRIVATE HEALTH INSURANCE | Attending: Emergency Medicine | Admitting: Emergency Medicine

## 2021-09-27 DIAGNOSIS — S61041A Puncture wound with foreign body of right thumb without damage to nail, initial encounter: Secondary | ICD-10-CM | POA: Insufficient documentation

## 2021-09-27 DIAGNOSIS — Y990 Civilian activity done for income or pay: Secondary | ICD-10-CM | POA: Diagnosis not present

## 2021-09-27 DIAGNOSIS — W298XXA Contact with other powered powered hand tools and household machinery, initial encounter: Secondary | ICD-10-CM | POA: Insufficient documentation

## 2021-09-27 DIAGNOSIS — S6991XA Unspecified injury of right wrist, hand and finger(s), initial encounter: Secondary | ICD-10-CM

## 2021-09-27 MED ORDER — CEPHALEXIN 500 MG PO CAPS: 500.0000 mg | ORAL_CAPSULE | Freq: Four times a day (QID) | ORAL | 0 refills | Status: AC

## 2021-09-27 MED ORDER — FENTANYL CITRATE PF 50 MCG/ML IJ SOSY
50.0000 ug | PREFILLED_SYRINGE | Freq: Once | INTRAMUSCULAR | Status: AC
  Administered 2021-09-27: 50 ug via INTRAVENOUS
  Filled 2021-09-27: qty 1

## 2021-09-27 MED ORDER — CEFAZOLIN SODIUM-DEXTROSE 1-4 GM/50ML-% IV SOLN
1.0000 g | Freq: Once | INTRAVENOUS | Status: AC
  Administered 2021-09-27: 1 g via INTRAVENOUS
  Filled 2021-09-27: qty 50

## 2021-09-27 MED ORDER — OXYCODONE-ACETAMINOPHEN 5-325 MG PO TABS
1.0000 | ORAL_TABLET | Freq: Once | ORAL | Status: DC
  Filled 2021-09-27: qty 1

## 2022-07-07 NOTE — Progress Notes (Signed)
 Urgent Care Department Provider Note   HPI  SUBJECTIVE:  Robert Ferguson is a 47 y.o. male who presents with complaints of having anxiety and panic today.  Patient states that he is run out of his blood pressure medicine and cannot get into see his primary care office.  Patient states that his primary care office does not take his insurance anymore and he could not get a refill on his blood pressure medicine.  Patient also states that he has been having more anxiety and today at work he had a anxiety attack and decided to come here and be seen.  Patient denies any homicidal or suicidal ideation.  History   Past Medical History:  Diagnosis Date  . Anxiety   . Depression   . Hypertension     No past surgical history on file.  History reviewed. No pertinent family history.  Social History   Tobacco Use  . Smoking status: Former    Current packs/day: 0.50    Types: Cigarettes    Passive exposure: Never  . Smokeless tobacco: Never  Vaping Use  . Vaping status: Never Used  Substance Use Topics  . Alcohol use: Yes  . Drug use: Never     Current Outpatient Medications:  .  cloNIDine HCL (CATAPRES) 0.1 MG tablet, Take 2 tablets (0.2 mg total) by mouth Two (2) times a day. (Patient not taking: Reported on 12/26/2021), Disp: 120 tablet, Rfl: 11 .  furosemide (LASIX) 40 MG tablet, Take one po bid x 3 days, Disp: 6 tablet, Rfl: 0 .  hydrOXYzine (ATARAX) 25 MG tablet, Take 1 tablet (25 mg total) by mouth every four (4) hours as needed for anxiety., Disp: 30 tablet, Rfl: 0 .  lisinopriL (PRINIVIL,ZESTRIL) 20 MG tablet, Take 1 tablet (20 mg total) by mouth daily., Disp: 30 tablet, Rfl: 3 .  lisinopril (PRINIVIL,ZESTRIL) 20 MG tablet, Take 1 tablet (20 mg total) by mouth daily., Disp: 30 tablet, Rfl: 11 .  potassium chloride  20 MEQ ER tablet, Take one po bid x 3 days, Disp: 6 tablet, Rfl: 0 No current facility-administered medications for this visit.  ROS   As noted in HPI. All other ROS  negative.    Physical Exam   BP 148/80   Pulse 67   Temp 36.3 C (97.4 F) (Oral)   Resp 18   Ht 182.9 cm (6')   Wt (!) 112.9 kg (249 lb)   SpO2 97%   BMI 33.77 kg/m   Constitutional:  Well developed, well nourished, no acute distress Eyes:   EOMI, conjunctiva normal bilaterally HENT:  Normocephalic, atraumatic,  Respiratory:  Normal inspiratory effort lungs CTA Cardiovascular:  Normal rate normal rhythm S1-S2 without murmur. GI:  nondistended Integument: No rash, skin intact Musculoskeletal:  No edema, no deformities Neurologic:  Alert & oriented x 3, no focal neuro deficits Psychiatric:  Speech and behavior pressured and patient anxious   Urgent Care Course   Orders Placed This Encounter  Procedures  . Ambulatory referral to Family Practice    Standing Status:   Future    Standing Expiration Date:   07/07/2023    Referral Priority:   Routine    Referral Type:   Generic Referral    Referral Reason:   Specialty Services Required    Number of Visits Requested:   1      Patient was never admitted. Urgent Care Clinical Impression   Final diagnoses:  Hypertension, unspecified type (Primary)  Anxiety    Urgent Care  Assessment/Plan  Clonidine 0.1 mg p.o. given here in the office with good effects.  Discussed medical decision-making, plan for follow-up with patient. Discussed signs and symptoms that should prompt return to the emergency department. Patient agrees with plan.  New Prescriptions   HYDROXYZINE (ATARAX) 25 MG TABLET    Take 1 tablet (25 mg total) by mouth every four (4) hours as needed for anxiety.   LISINOPRIL (PRINIVIL,ZESTRIL) 20 MG TABLET    Take 1 tablet (20 mg total) by mouth daily.

## 2023-01-25 NOTE — Telephone Encounter (Signed)
 Called pt to remind him to return zio monitorl, pt stated he had misplaced it when it came off but he has it now and is planning to mail it back today.

## 2024-02-20 ENCOUNTER — Encounter (HOSPITAL_COMMUNITY): Payer: Self-pay | Admitting: Emergency Medicine

## 2024-02-20 ENCOUNTER — Emergency Department (HOSPITAL_COMMUNITY)
Admission: EM | Admit: 2024-02-20 | Discharge: 2024-02-20 | Disposition: A | Discharge status: Home / Self Care | Attending: Emergency Medicine | Admitting: Emergency Medicine

## 2024-02-20 ENCOUNTER — Other Ambulatory Visit: Payer: Self-pay

## 2024-02-20 ENCOUNTER — Emergency Department (HOSPITAL_COMMUNITY)

## 2024-02-20 DIAGNOSIS — R7401 Elevation of levels of liver transaminase levels: Secondary | ICD-10-CM | POA: Diagnosis not present

## 2024-02-20 DIAGNOSIS — F17210 Nicotine dependence, cigarettes, uncomplicated: Secondary | ICD-10-CM | POA: Diagnosis not present

## 2024-02-20 DIAGNOSIS — R079 Chest pain, unspecified: Secondary | ICD-10-CM | POA: Diagnosis present

## 2024-02-20 LAB — COMPREHENSIVE METABOLIC PANEL WITH GFR
ALT: 48 U/L — ABNORMAL HIGH (ref 0–44)
AST: 34 U/L (ref 15–41)
Albumin: 4.1 g/dL (ref 3.5–5.0)
Alkaline Phosphatase: 96 U/L (ref 38–126)
Anion gap: 10 (ref 5–15)
BUN: 9 mg/dL (ref 6–20)
CO2: 28 mmol/L (ref 22–32)
Calcium: 8.9 mg/dL (ref 8.9–10.3)
Chloride: 102 mmol/L (ref 98–111)
Creatinine, Ser: 0.77 mg/dL (ref 0.61–1.24)
GFR, Estimated: >60 mL/min (ref >60)
Glucose, Bld: 141 mg/dL — ABNORMAL HIGH (ref 70–99)
Potassium: 4 mmol/L (ref 3.5–5.1)
Sodium: 139 mmol/L (ref 135–145)
Total Bilirubin: 0.3 mg/dL (ref 0.0–1.2)
Total Protein: 6.6 g/dL (ref 6.5–8.1)

## 2024-02-20 LAB — CBC
HCT: 49.5 % (ref 39.0–52.0)
Hemoglobin: 16.9 g/dL (ref 13.0–17.0)
MCH: 29.4 pg (ref 26.0–34.0)
MCHC: 34.1 g/dL (ref 30.0–36.0)
MCV: 86.1 fL (ref 80.0–100.0)
Platelets: 193 K/uL (ref 150–400)
RBC: 5.75 MIL/uL (ref 4.22–5.81)
RDW: 11.8 % (ref 11.5–15.5)
WBC: 6.5 K/uL (ref 4.0–10.5)
nRBC: 0 % (ref 0.0–0.2)

## 2024-02-20 LAB — LIPASE, BLOOD: Lipase: 56 U/L — ABNORMAL HIGH (ref 11–51)

## 2024-02-20 LAB — TROPONIN T, HIGH SENSITIVITY
Troponin T High Sensitivity: <15 ng/L (ref 0–19)
Troponin T High Sensitivity: <15 ng/L (ref 0–19)

## 2024-02-20 MED ORDER — KETOROLAC TROMETHAMINE 15 MG/ML IJ SOLN
15.0000 mg | Freq: Once | INTRAMUSCULAR | Status: AC
  Administered 2024-02-20: 15 mg via INTRAVENOUS
  Filled 2024-02-20: qty 1

## 2024-02-20 MED ORDER — LORAZEPAM 2 MG/ML IJ SOLN
1.0000 mg | Freq: Once | INTRAMUSCULAR | Status: AC
  Administered 2024-02-20: 1 mg via INTRAVENOUS
  Filled 2024-02-20: qty 1

## 2024-02-20 MED ORDER — OMEPRAZOLE 20 MG PO CPDR
20.0000 mg | DELAYED_RELEASE_CAPSULE | Freq: Every day | ORAL | 1 refills | Status: AC
Start: 2024-02-20 — End: ?

## 2024-02-20 NOTE — Discharge Instructions (Signed)
 You were evaluated in the Emergency Department and after careful evaluation, we did not find any emergent condition requiring admission or further testing in the hospital.  Your exam/testing today is overall reassuring.  Symptoms may be related to acid reflux.  We also discussed the possibility of the pain being related to the gallbladder.  Reassuring that your pain improved and your blood tests were normal.  Would recommend taking the omeprazole daily for pain prevention, I would recommend taking it easy on your diet over the next few days.  Please return to the Emergency Department if you experience any worsening of your condition.   Thank you for allowing us  to be a part of your care.

## 2024-02-20 NOTE — ED Provider Notes (Signed)
 [facility]-EMERGENCY DEPT [facility] Emergency Department Provider Note MRN:  [id number]  Arrival date & time: 02/20/24     Chief Complaint   Chest Pain   History of Present Illness   Robert Ferguson is a 48 y.o. year-old male with no pertinent past medical history presenting to the ED with chief complaint of chest pain.  Woke up with significant pain to the right lower chest/right upper quadrant.  Explains that he ate a bunch of junk food before going to bed.  The pain is causing him to feel very anxious.  Review of Systems  A thorough review of systems was obtained and all systems are negative except as noted in the HPI and PMH.   Patient's Health History    Past Medical History:  Diagnosis Date   Back pain    Cellulitis of left leg 2008   MRSA infection     Past Surgical History:  Procedure Laterality Date   WISDOM TOOTH EXTRACTION      History reviewed. No pertinent family history.  Social History   Socioeconomic History   Marital status: Divorced    Spouse name: Not on file   Number of children: Not on file   Years of education: Not on file   Highest education level: Not on file  Occupational History   Not on file  Tobacco Use   Smoking status: Some Days    Current packs/day: 0.25    Average packs/day: 0.3 packs/day for 15.0 years (3.8 ttl pk-yrs)    Types: Cigarettes   Smokeless tobacco: Never   Tobacco comments:    0.25ppw - refused nicotine  Vaping Use   Vaping status: Never Used  Substance and Sexual Activity   Alcohol use: Yes    Comment: Occ   Drug use: No   Sexual activity: Yes    Birth control/protection: None    Comment: same sexual partner  Other Topics Concern   Not on file  Social History Narrative   Not on file   Social Drivers of Health   Financial Resource Strain: Not on file  Food Insecurity: No Food Insecurity (05/22/2020)   Received from [facility]   Hunger Vital Sign    Within the past 12 months, you worried that your food  would run out before you got the money to buy more.: Never true    Within the past 12 months, the food you bought just didn't last and you didn't have money to get more.: Never true  Transportation Needs: Not on file  Physical Activity: Not on file  Stress: Not on file  Social Connections: Unknown (08/18/2021)   Received from [facility]   Social Network    Social Network: Not on file  Intimate Partner Violence: Unknown (07/10/2021)   Received from [facility]   HITS    Physically Hurt: Not on file    Insult or Talk Down To: Not on file    Threaten Physical Harm: Not on file    Scream or Curse: Not on file     Physical Exam   Vitals:   02/20/24 0500 02/20/24 0600  BP: (!) 143/71 (!) 147/79  Pulse: 71 69  Resp: 17 15  Temp:    SpO2: 96% 94%    CONSTITUTIONAL: Well-appearing, NAD NEURO/PSYCH:  Alert and oriented x 3, no focal deficits EYES:  eyes equal and reactive ENT/NECK:  no LAD, no JVD CARDIO: Regular rate, well-perfused, normal S1 and S2 PULM:  CTAB no wheezing or  rhonchi GI/GU:  non-distended, non-tender MSK/SPINE:  No gross deformities, no edema SKIN:  no rash, atraumatic   *Additional and/or pertinent findings included in MDM below  Diagnostic and Interventional Summary    EKG Interpretation Date/Time:  "Sunday February 20 2024 03:30:27 EST Ventricular Rate:  66 PR Interval:  148 QRS Duration:  79 QT Interval:  381 QTC Calculation: 400 R Axis:   65  Text Interpretation: Sinus rhythm Anteroseptal infarct, old Minimal ST elevation, inferior leads Confirmed by Robert Ferguson ([id number]) on 02/20/2024 4:25:39 AM       Labs Reviewed  COMPREHENSIVE METABOLIC PANEL WITH GFR - Abnormal; Notable for the following components:      Result Value   Glucose, Bld 141 (*)    ALT 48 (*)    All other components within normal limits  LIPASE, BLOOD - Abnormal; Notable for the following components:   Lipase 56 (*)    All other components within normal limits  CBC   TROPONIN T, HIGH SENSITIVITY  TROPONIN T, HIGH SENSITIVITY    DG Chest Portable 1 View  Final Result      Medications  LORazepam (ATIVAN) injection 1 mg (1 mg Intravenous Given 02/20/24 0418)  ketorolac (TORADOL) 15 MG/ML injection 15 mg (15 mg Intravenous Given 02/20/24 0418)     Procedures  /  Critical Care Procedures  ED Course and Medical Decision Making  Initial Impression and Ddx Could be GERD, could be biliary colic, would be an atypical presentation of ACS.  Seems a bit anxious.  Reassuring vital signs.  Past medical/surgical history that increases complexity of ED encounter: None  Interpretation of Diagnostics I personally reviewed the EKG and my interpretation is as follows: Sinus rhythm without ischemic findings  No significant blood count or electrolyte disturbance.  Minimally elevated ALT and lipase of unclear significance.  Patient Reassessment and Ultimate Disposition/Management     Patient feeling much better on reassessment, normal vitals, has no right upper quadrant tenderness.  Troponin negative x 2.  I feel that imaging at this time would be very low yield given patient's exam, appropriate for discharge with return precautions.  Patient management required discussion with the following services or consulting groups:  None  Complexity of Problems Addressed Acute illness or injury that poses threat of life of bodily function  Additional Data Reviewed and Analyzed Further history obtained from: Past medical history and medications listed in the EMR  Additional Factors Impacting ED Encounter Risk Use of parenteral controlled substances and Consideration of hospitalization  Robert Ferguson M. Robert Balkcom, MD [facility] [email or web address]  Final Clinical Impressions(s) / ED Diagnoses     ICD-10-CM   1. Chest pain, unspecified type  R07.9       ED Discharge Orders          Ordered    omeprazole (PRILOSEC) 20 MG  capsule  Daily        11" /16/25 0640             Discharge Instructions Discussed with and Provided to Patient:     Discharge Instructions      You were evaluated in the Emergency Department and after careful evaluation, we did not find any emergent condition requiring admission or further testing in the hospital.  Your exam/testing today is overall reassuring.  Symptoms may be related to acid reflux.  We also discussed the possibility of the pain being related to the gallbladder.  Reassuring that your pain improved and your blood  tests were normal.  Would recommend taking the omeprazole daily for pain prevention, I would recommend taking it easy on your diet over the next few days.  Please return to the Emergency Department if you experience any worsening of your condition.   Thank you for allowing us  to be a part of your care.       Robert Ozell HERO, MD 02/20/24 [phone]

## 2024-02-20 NOTE — ED Triage Notes (Signed)
 Pt woke up from sleep with RUQ abdominal pain and R sided chest pain. Pt believed to be indigestion so he took an antacid but pain hasn't gone away.

## 2024-02-25 ENCOUNTER — Other Ambulatory Visit: Payer: Self-pay

## 2024-02-25 ENCOUNTER — Emergency Department (HOSPITAL_COMMUNITY)
Admission: EM | Admit: 2024-02-25 | Discharge: 2024-02-26 | Disposition: A | Discharge status: Home / Self Care | Payer: PRIVATE HEALTH INSURANCE | Attending: Emergency Medicine | Admitting: Emergency Medicine

## 2024-02-25 ENCOUNTER — Encounter (HOSPITAL_COMMUNITY): Payer: Self-pay

## 2024-02-25 ENCOUNTER — Emergency Department (HOSPITAL_COMMUNITY): Payer: PRIVATE HEALTH INSURANCE

## 2024-02-25 DIAGNOSIS — R1013 Epigastric pain: Secondary | ICD-10-CM | POA: Diagnosis present

## 2024-02-25 DIAGNOSIS — K8050 Calculus of bile duct without cholangitis or cholecystitis without obstruction: Secondary | ICD-10-CM | POA: Insufficient documentation

## 2024-02-25 DIAGNOSIS — F17210 Nicotine dependence, cigarettes, uncomplicated: Secondary | ICD-10-CM | POA: Insufficient documentation

## 2024-02-25 MED ORDER — LORAZEPAM 1 MG PO TABS
1.0000 mg | ORAL_TABLET | Freq: Once | ORAL | Status: AC
  Administered 2024-02-25: 1 mg via ORAL
  Filled 2024-02-25: qty 1

## 2024-02-25 MED ORDER — ALUM & MAG HYDROXIDE-SIMETH 200-200-20 MG/5ML PO SUSP
30.0000 mL | Freq: Once | ORAL | Status: AC
  Administered 2024-02-25: 30 mL via ORAL
  Filled 2024-02-25: qty 30

## 2024-02-25 NOTE — ED Triage Notes (Signed)
 Pov form home. Cc of right side chest pain that started around an hour ago. Took tums, gas-x without relief. Says the pain is making him anxious.  8/10 pain Says it feels like it did when he was here on the 16th.

## 2024-02-25 NOTE — ED Provider Notes (Signed)
 [facility]-EMERGENCY DEPT [facility] Emergency Department Provider Note MRN:  [id number]  Arrival date & time: 02/26/24     Chief Complaint   Chest Pain   History of Present Illness   Robert Ferguson is a 48 y.o. year-old male with no pertinent past medical history presenting to the ED with chief complaint of chest pain.  Pain starting 30 minutes after dinner tonight.  Had a similar episode last week.  Ate fish for both of the meals.  Pain located in the epigastrium and migrates to the right upper quadrant and then migrates to the center of the chest and then migrates to the right side of the chest.  The pain is making him anxious and worried that something more serious is wrong but he thinks that is probably just what he ate.  Denies shortness of breath, denies pain worse with deep breaths, nausea but no vomiting.  No lower abdominal pain.  Feels like he has extra air in his stomach and needs to belch.  Review of Systems  A thorough review of systems was obtained and all systems are negative except as noted in the HPI and PMH.   Patient's Health History    Past Medical History:  Diagnosis Date   Back pain    Cellulitis of left leg 2008   MRSA infection     Past Surgical History:  Procedure Laterality Date   WISDOM TOOTH EXTRACTION      History reviewed. No pertinent family history.  Social History   Socioeconomic History   Marital status: Divorced    Spouse name: Not on file   Number of children: Not on file   Years of education: Not on file   Highest education level: Not on file  Occupational History   Not on file  Tobacco Use   Smoking status: Some Days    Current packs/day: 0.25    Average packs/day: 0.3 packs/day for 15.0 years (3.8 ttl pk-yrs)    Types: Cigarettes   Smokeless tobacco: Never   Tobacco comments:    0.25ppw - refused nicotine  Vaping Use   Vaping status: Never Used  Substance and Sexual Activity   Alcohol use: Yes    Comment: Occ   Drug use: No    Sexual activity: Yes    Birth control/protection: None    Comment: same sexual partner  Other Topics Concern   Not on file  Social History Narrative   Not on file   Social Drivers of Health   Financial Resource Strain: Not on file  Food Insecurity: No Food Insecurity (05/22/2020)   Received from [facility]   Hunger Vital Sign    Within the past 12 months, you worried that your food would run out before you got the money to buy more.: Never true    Within the past 12 months, the food you bought just didn't last and you didn't have money to get more.: Never true  Transportation Needs: Not on file  Physical Activity: Not on file  Stress: Not on file  Social Connections: Unknown (08/18/2021)   Received from [facility]   Social Network    Social Network: Not on file  Intimate Partner Violence: Unknown (07/10/2021)   Received from [facility]   HITS    Physically Hurt: Not on file    Insult or Talk Down To: Not on file    Threaten Physical Harm: Not on file    Scream or Curse: Not on file  Physical Exam   Vitals:   02/25/24 2310  BP: (!) 167/104  Pulse: 80  Resp: 19  Temp: 97.7 F (36.5 C)  SpO2: 96%    CONSTITUTIONAL: Well-appearing, NAD NEURO/PSYCH:  Alert and oriented x 3, no focal deficits EYES:  eyes equal and reactive ENT/NECK:  no LAD, no JVD CARDIO: Regular rate, well-perfused, normal S1 and S2 PULM:  CTAB no wheezing or rhonchi GI/GU:  non-distended, non-tender MSK/SPINE:  No gross deformities, no edema SKIN:  no rash, atraumatic   *Additional and/or pertinent findings included in MDM below  Diagnostic and Interventional Summary    EKG Interpretation Date/Time:  Friday February 25 2024 23:14:20 EST Ventricular Rate:  68 PR Interval:  139 QRS Duration:  88 QT Interval:  384 QTC Calculation: 409 R Axis:   63  Text Interpretation: Sinus rhythm ST elev, probable normal early repol pattern Confirmed by Theadore Sharper [phone]) on 02/25/2024  11:25:42 PM       Labs Reviewed  COMPREHENSIVE METABOLIC PANEL WITH GFR - Abnormal; Notable for the following components:      Result Value   Glucose, Bld 174 (*)    Total Protein 6.4 (*)    All other components within normal limits  D-DIMER, QUANTITATIVE  CBC  LIPASE, BLOOD  PROTIME-INR  TROPONIN T, HIGH SENSITIVITY  TROPONIN T, HIGH SENSITIVITY    CT ABDOMEN PELVIS W CONTRAST  Final Result    DG Chest Port 1 View  Final Result      Medications  alum & mag hydroxide-simeth (MAALOX/MYLANTA) 200-200-20 MG/5ML suspension 30 mL (30 mLs Oral Given 02/25/24 2335)  LORazepam  (ATIVAN ) tablet 1 mg (1 mg Oral Given 02/25/24 2335)  morphine  (PF) 4 MG/ML injection 4 mg (4 mg Intravenous Given 02/26/24 0117)  iohexol  (OMNIPAQUE ) 300 MG/ML solution 100 mL (100 mLs Intravenous Contrast Given 02/26/24 0201)     Procedures  /  Critical Care Procedures  ED Course and Medical Decision Making  Initial Impression and Ddx Favoring indigestion or gastritis or GERD as the most likely cause based on description.  Other considerations include biliary colic.  No significant right upper quadrant tenderness on exam, overall highly doubt cholecystitis.  ACS and PE would be less likely considerations.  Pancreatitis is considered as well.  Had a reassuring workup last week, I was his provider at that time as well.  I think ultrasound of the right upper quadrant would be an ideal test but not available at this time.  Will attempt symptomatic management and follow-up labs, will consider CT imaging if he is not feeling better.  Past medical/surgical history that increases complexity of ED encounter: None  Interpretation of Diagnostics I personally reviewed the Laboratory Testing and my interpretation is as follows: No significant blood count or electrolyte disturbance.  With continued pain here in the emergency department CT imaging was obtained revealing evidence of cholelithiasis, no heart evidence for  cholecystitis.  Patient Reassessment and Ultimate Disposition/Management     Patient feeling better after medications above, observed for an additional hour or 2 with no return of pain.  Continues to have no fever, soft abdomen with no focal tenderness.  Suspect biliary colic especially given the CT imaging.  Discharged with general surgery follow-up, return precautions.  Patient management required discussion with the following services or consulting groups:  None  Complexity of Problems Addressed Acute illness or injury that poses threat of life of bodily function  Additional Data Reviewed and Analyzed Further history obtained from: Further history from  spouse/family member  Additional Factors Impacting ED Encounter Risk Consideration of hospitalization  Ozell HERO. Theadore, MD [facility] Emergency Medicine [facility] [email or web address]  Final Clinical Impressions(s) / ED Diagnoses     ICD-10-CM   1. Biliary colic  K80.50       ED Discharge Orders          Ordered    naproxen  (NAPROSYN ) 500 MG tablet  2 times daily        02/26/24 0334             Discharge Instructions Discussed with and Provided to Patient:     Discharge Instructions      You were evaluated in the Emergency Department and after careful evaluation, we did not find any emergent condition requiring admission or further testing in the hospital.  Your exam/testing today is overall reassuring.  Symptoms likely due to a stone in the gallbladder.  Use the Naprosyn  twice daily as needed for pain.  Follow-up with Dr. Mavis of general surgery.  Please return to the Emergency Department if you experience any worsening of your condition.   Thank you for allowing us  to be a part of your care.       Theadore Ozell HERO, MD 02/26/24 [phone]

## 2024-02-26 ENCOUNTER — Emergency Department (HOSPITAL_COMMUNITY)

## 2024-02-26 LAB — CBC
HCT: 49.1 % (ref 39.0–52.0)
Hemoglobin: 16.6 g/dL (ref 13.0–17.0)
MCH: 29 pg (ref 26.0–34.0)
MCHC: 33.8 g/dL (ref 30.0–36.0)
MCV: 85.8 fL (ref 80.0–100.0)
Platelets: 189 K/uL (ref 150–400)
RBC: 5.72 MIL/uL (ref 4.22–5.81)
RDW: 12 % (ref 11.5–15.5)
WBC: 9.1 K/uL (ref 4.0–10.5)
nRBC: 0 % (ref 0.0–0.2)

## 2024-02-26 LAB — LIPASE, BLOOD: Lipase: 42 U/L (ref 11–51)

## 2024-02-26 LAB — COMPREHENSIVE METABOLIC PANEL WITH GFR
ALT: 28 U/L (ref 0–44)
AST: 16 U/L (ref 15–41)
Albumin: 3.9 g/dL (ref 3.5–5.0)
Alkaline Phosphatase: 87 U/L (ref 38–126)
Anion gap: 10 (ref 5–15)
BUN: 11 mg/dL (ref 6–20)
CO2: 26 mmol/L (ref 22–32)
Calcium: 9.2 mg/dL (ref 8.9–10.3)
Chloride: 102 mmol/L (ref 98–111)
Creatinine, Ser: 0.62 mg/dL (ref 0.61–1.24)
GFR, Estimated: >60 mL/min (ref >60)
Glucose, Bld: 174 mg/dL — ABNORMAL HIGH (ref 70–99)
Potassium: 3.9 mmol/L (ref 3.5–5.1)
Sodium: 138 mmol/L (ref 135–145)
Total Bilirubin: 0.3 mg/dL (ref 0.0–1.2)
Total Protein: 6.4 g/dL — ABNORMAL LOW (ref 6.5–8.1)

## 2024-02-26 LAB — PROTIME-INR
INR: 0.9 (ref 0.8–1.2)
Prothrombin Time: 12.8 s (ref 11.4–15.2)

## 2024-02-26 LAB — TROPONIN T, HIGH SENSITIVITY
Troponin T High Sensitivity: <15 ng/L (ref 0–19)
Troponin T High Sensitivity: <15 ng/L (ref 0–19)

## 2024-02-26 LAB — D-DIMER, QUANTITATIVE: D-Dimer, Quant: 0.27 ug{FEU}/mL (ref 0.00–0.50)

## 2024-02-26 MED ORDER — IOHEXOL 300 MG/ML  SOLN
100.0000 mL | Freq: Once | INTRAMUSCULAR | Status: AC | PRN
  Administered 2024-02-26: 100 mL via INTRAVENOUS

## 2024-02-26 MED ORDER — NAPROXEN 500 MG PO TABS: 500.0000 mg | ORAL_TABLET | Freq: Two times a day (BID) | ORAL | 0 refills | Status: AC

## 2024-02-26 MED ORDER — MORPHINE SULFATE (PF) 4 MG/ML IV SOLN
4.0000 mg | Freq: Once | INTRAVENOUS | Status: AC
  Administered 2024-02-26: 4 mg via INTRAVENOUS
  Filled 2024-02-26: qty 1

## 2024-02-26 NOTE — Discharge Instructions (Addendum)
 You were evaluated in the Emergency Department and after careful evaluation, we did not find any emergent condition requiring admission or further testing in the hospital.  Your exam/testing today is overall reassuring.  Symptoms likely due to a stone in the gallbladder.  Use the Naprosyn  twice daily as needed for pain.  Follow-up with Dr. Mavis of general surgery.  Please return to the Emergency Department if you experience any worsening of your condition.   Thank you for allowing us  to be a part of your care.

## 2024-03-23 ENCOUNTER — Ambulatory Visit: Payer: PRIVATE HEALTH INSURANCE | Admitting: Surgery

## 2024-04-23 ENCOUNTER — Emergency Department (HOSPITAL_COMMUNITY): Admission: EM | Admit: 2024-04-23 | Discharge: 2024-04-23 | Disposition: A | Discharge status: Home / Self Care

## 2024-04-23 ENCOUNTER — Emergency Department (HOSPITAL_COMMUNITY)

## 2024-04-23 DIAGNOSIS — K8020 Calculus of gallbladder without cholecystitis without obstruction: Secondary | ICD-10-CM | POA: Diagnosis not present

## 2024-04-23 DIAGNOSIS — R109 Unspecified abdominal pain: Secondary | ICD-10-CM | POA: Diagnosis present

## 2024-04-23 LAB — BASIC METABOLIC PANEL WITH GFR
Anion gap: 11 (ref 5–15)
BUN: 9 mg/dL (ref 6–20)
CO2: 27 mmol/L (ref 22–32)
Calcium: 9.1 mg/dL (ref 8.9–10.3)
Chloride: 103 mmol/L (ref 98–111)
Creatinine, Ser: 0.64 mg/dL (ref 0.61–1.24)
GFR, Estimated: >60 mL/min
Glucose, Bld: 113 mg/dL — ABNORMAL HIGH (ref 70–99)
Potassium: 3.8 mmol/L (ref 3.5–5.1)
Sodium: 141 mmol/L (ref 135–145)

## 2024-04-23 LAB — CBC WITH DIFFERENTIAL/PLATELET
Abs Immature Granulocytes: 0.04 K/uL (ref 0.00–0.07)
Basophils Absolute: 0 K/uL (ref 0.0–0.1)
Basophils Relative: 1 %
Eosinophils Absolute: 0.2 K/uL (ref 0.0–0.5)
Eosinophils Relative: 3 %
HCT: 51.6 % (ref 39.0–52.0)
Hemoglobin: 17.4 g/dL — ABNORMAL HIGH (ref 13.0–17.0)
Immature Granulocytes: 1 %
Lymphocytes Relative: 24 %
Lymphs Abs: 1.9 K/uL (ref 0.7–4.0)
MCH: 29.4 pg (ref 26.0–34.0)
MCHC: 33.7 g/dL (ref 30.0–36.0)
MCV: 87.2 fL (ref 80.0–100.0)
Monocytes Absolute: 0.9 K/uL (ref 0.1–1.0)
Monocytes Relative: 12 %
Neutro Abs: 4.7 K/uL (ref 1.7–7.7)
Neutrophils Relative %: 59 %
Platelets: 178 K/uL (ref 150–400)
RBC: 5.92 MIL/uL — ABNORMAL HIGH (ref 4.22–5.81)
RDW: 12.1 % (ref 11.5–15.5)
WBC: 7.8 K/uL (ref 4.0–10.5)
nRBC: 0 % (ref 0.0–0.2)

## 2024-04-23 LAB — HEPATIC FUNCTION PANEL
ALT: 20 U/L (ref 0–44)
AST: 14 U/L — ABNORMAL LOW (ref 15–41)
Albumin: 4.1 g/dL (ref 3.5–5.0)
Alkaline Phosphatase: 79 U/L (ref 38–126)
Bilirubin, Direct: 0.2 mg/dL (ref 0.0–0.2)
Indirect Bilirubin: 0.1 mg/dL — ABNORMAL LOW (ref 0.3–0.9)
Total Bilirubin: 0.3 mg/dL (ref 0.0–1.2)
Total Protein: 6.6 g/dL (ref 6.5–8.1)

## 2024-04-23 LAB — LIPASE, BLOOD: Lipase: 50 U/L (ref 11–51)

## 2024-04-23 MED ORDER — ONDANSETRON HCL 4 MG/2ML IJ SOLN
4.0000 mg | Freq: Once | INTRAMUSCULAR | Status: AC
  Administered 2024-04-23: 4 mg via INTRAVENOUS
  Filled 2024-04-23: qty 2

## 2024-04-23 MED ORDER — LACTATED RINGERS IV BOLUS
1000.0000 mL | Freq: Once | INTRAVENOUS | Status: AC
  Administered 2024-04-23: 1000 mL via INTRAVENOUS

## 2024-04-23 MED ORDER — HYDROMORPHONE HCL 1 MG/ML IJ SOLN
0.5000 mg | Freq: Once | INTRAMUSCULAR | Status: AC
  Administered 2024-04-23: 0.5 mg via INTRAVENOUS
  Filled 2024-04-23: qty 0.5

## 2024-04-23 MED ORDER — ONDANSETRON HCL 4 MG PO TABS: 4.0000 mg | ORAL_TABLET | Freq: Three times a day (TID) | ORAL | 0 refills | Status: AC | PRN

## 2024-04-23 NOTE — Discharge Instructions (Signed)
 Take Tylenol  Motrin  as needed for pain.  Take your Zofran  as needed for nausea and vomiting.  Avoid fatty or fried foods.  Call and follow-up with surgery.

## 2024-04-23 NOTE — ED Triage Notes (Signed)
 Pt c/o RUQ abd pain radiating up into his chest that started this morning. Pt reports that he was recently diagnosed with gall stones. Pt endorses nausea, no vomiting or diarrhea.

## 2024-04-23 NOTE — ED Provider Notes (Signed)
 " [place] EMERGENCY DEPARTMENT AT [facility] Provider Note   CSN: [id number] Arrival date & time: 04/23/24  9276     Patient presents with: Abdominal Pain   Robert Ferguson is a 49 y.o. male.   49 year old male presents for evaluation of abdominal pain.  Is a history of gallstones.  He states he had a good week/day and woke up this way with some right upper quadrant abdominal pain and nausea.  Has not had any vomiting.  States he was post to follow-up for an ultrasound but never did.  Denies any other symptoms or concerns.   Abdominal Pain Associated symptoms: nausea   Associated symptoms: no chest pain, no chills, no cough, no dysuria, no fever, no hematuria, no shortness of breath, no sore throat and no vomiting        Prior to Admission medications  Medication Sig Start Date End Date Taking? Authorizing Provider  ondansetron  (ZOFRAN ) 4 MG tablet Take 1 tablet (4 mg total) by mouth every 8 (eight) hours as needed for up to 4 days. 04/23/24 04/27/24 Yes Jiovani Mccammon L, DO  guaifenesin  (ROBITUSSIN) 100 MG/5ML syrup Take 200 mg by mouth 3 (three) times daily as needed for cough.    [provider]  HYDROcodone -acetaminophen  (NORCO/VICODIN) 5-325 MG tablet 1 or 2 tabs PO q8 hours prn pain 09/07/18   Joyice Sauer, DO  methocarbamol  (ROBAXIN ) 500 MG tablet Take 2 tablets (1,000 mg total) by mouth 4 (four) times daily as needed for muscle spasms (muscle spasm/pain). 09/17/18   Muthersbaugh, Chiquita, PA-C  naproxen  (NAPROSYN ) 500 MG tablet Take 1 tablet (500 mg total) by mouth 2 (two) times daily. 02/26/24   Theadore Ozell CHRISTELLA, MD  omeprazole  (PRILOSEC) 20 MG capsule Take 1 capsule (20 mg total) by mouth daily. 02/20/24   Theadore Ozell CHRISTELLA, MD    Allergies: Tramadol    Review of Systems  Constitutional:  Negative for chills and fever.  HENT:  Negative for ear pain and sore throat.   Eyes:  Negative for pain and visual disturbance.  Respiratory:  Negative for cough  and shortness of breath.   Cardiovascular:  Negative for chest pain and palpitations.  Gastrointestinal:  Positive for abdominal pain and nausea. Negative for vomiting.  Genitourinary:  Negative for dysuria and hematuria.  Musculoskeletal:  Negative for arthralgias and back pain.  Skin:  Negative for color change and rash.  Neurological:  Negative for seizures and syncope.  All other systems reviewed and are negative.   Updated Vital Signs BP 134/73   Pulse 62   Temp 97.6 F (36.4 C)   Resp 16   Ht 6' (1.829 m)   Wt 102.1 kg   SpO2 97%   BMI 30.52 kg/m   Physical Exam Vitals and nursing note reviewed.  Constitutional:      General: He is not in acute distress.    Appearance: He is well-developed. He is not ill-appearing.  HENT:     Head: Normocephalic and atraumatic.  Eyes:     Conjunctiva/sclera: Conjunctivae normal.  Cardiovascular:     Rate and Rhythm: Normal rate and regular rhythm.     Heart sounds: No murmur heard. Pulmonary:     Effort: Pulmonary effort is normal. No respiratory distress.     Breath sounds: Normal breath sounds.  Abdominal:     Palpations: Abdomen is soft.     Tenderness: There is abdominal tenderness in the right upper quadrant. Negative signs include Murphy's sign.  Musculoskeletal:        General: No swelling.     Cervical back: Neck supple.  Skin:    General: Skin is warm and dry.     Capillary Refill: Capillary refill takes less than 2 seconds.  Neurological:     Mental Status: He is alert.  Psychiatric:        Mood and Affect: Mood normal.     (all labs ordered are listed, but only abnormal results are displayed) Labs Reviewed  BASIC METABOLIC PANEL WITH GFR - Abnormal; Notable for the following components:      Result Value   Glucose, Bld 113 (*)    All other components within normal limits  HEPATIC FUNCTION PANEL - Abnormal; Notable for the following components:   AST 14 (*)    Indirect Bilirubin 0.1 (*)    All other  components within normal limits  CBC WITH DIFFERENTIAL/PLATELET - Abnormal; Notable for the following components:   RBC 5.92 (*)    Hemoglobin 17.4 (*)    All other components within normal limits  LIPASE, BLOOD    EKG: EKG Interpretation Date/Time:  Sunday April 23 2024 07:37:17 EST Ventricular Rate:  66 PR Interval:  157 QRS Duration:  85 QT Interval:  383 QTC Calculation: 402 R Axis:   79  Text Interpretation: Sinus rhythm Compared with prior EKG from 02/25/2024 Confirmed by Gennaro Bouchard ([id number]) on 04/23/2024 7:42:42 AM  Radiology: US  Abdomen Limited Result Date: 04/23/2024 EXAM: Right Upper Quadrant Abdominal Ultrasound 04/23/2024 08:50:23 AM TECHNIQUE: Real-time ultrasonography of the right upper quadrant of the abdomen was performed. COMPARISON: CT of the abdomen and pelvis from 02/26/2024. CLINICAL HISTORY: RUQ US  - having pain, hx of gallstones. FINDINGS: LIVER: Mildly increased parenchymal echogenicity. No intrahepatic biliary ductal dilatation. No evidence of mass. Hepatopetal flow in the portal vein. BILIARY SYSTEM: Gallstones are identified. The largest measures 4 mm. No gallbladder sludge, pericholecystic fluid, or sonographic Murphy sign. The gallbladder wall thickness measures 2.3 mm. The common bile duct is normal in caliber, measuring 4 mm. OTHER: No right upper quadrant ascites. IMPRESSION: 1. Cholelithiasis without sonographic evidence of acute cholecystitis. 2. Normal common bile duct caliber, measuring 4 mm. Electronically signed by: Waddell Calk MD 04/23/2024 09:34 AM EST RP Workstation: [id number]     Procedures   Medications Ordered in the ED  ondansetron  (ZOFRAN ) injection 4 mg (4 mg Intravenous Given 04/23/24 0751)  lactated ringers  bolus 1,000 mL (0 mLs Intravenous Stopped 04/23/24 0849)  HYDROmorphone  (DILAUDID ) injection 0.5 mg (0.5 mg Intravenous Given 04/23/24 0751)                                    Medical Decision Making Cardiac monitor  interpretation: Sinus rhythm, no ectopy  Patient here for right upper quadrant abdominal pain rating to his chest.  Has known history of gallstones.  Vitals are stable.  He was given Dilaudid  fluids and Zofran  and feeling much better.  Lab work is unremarkable and imaging shows no cholecystitis.  Does have a history of gallstones however.  I gave him a follow-up recommendation for general surgery he was provided with the phone number.  He was also follow-up before more and interpreted.  Will give prescription for Zofran  to use as needed.  Educated on diet modifications and advised Tylenol  Motrin  as needed for now.  He feels comfortable being discharged home.  Advised to return for any new or  worsening symptoms.  Problems Addressed: Gallbladder colic: acute illness or injury  Amount and/or Complexity of Data Reviewed External Data Reviewed: notes.    Details: Prior ED records reviewed and patient has been seen for gallbladder disease in the past Labs: ordered. Decision-making details documented in ED Course.    Details: Ordered and reviewed by me and unremarkable Radiology: ordered and independent interpretation performed. Decision-making details documented in ED Course.    Details: Ordered and interpreted by me independently of radiology Right upper quadrant ultrasound: Shows no acute abnormality has  known gallstones ECG/medicine tests: ordered and independent interpretation performed. Decision-making details documented in ED Course.    Details: Ordered and interpreted by me in the absence of cardiology and shows sinus rhythm, no STEMI, or significant change when compared to prior EKG  Risk OTC drugs. Prescription drug management. Parenteral controlled substances. Drug therapy requiring intensive monitoring for toxicity.     Final diagnoses:  Gallbladder colic    ED Discharge Orders          Ordered    ondansetron  (ZOFRAN ) 4 MG tablet  Every 8 hours PRN        04/23/24 1035                Kaiea Esselman L, DO 04/23/24 1426  "
